# Patient Record
Sex: Female | Born: 1985 | Race: Black or African American | Hispanic: No | Marital: Single | Smoking: Current every day smoker
Health system: Southern US, Community
[De-identification: ages and names within clinical notes are randomized; demographics above are authoritative.]

## PROBLEM LIST (undated history)

## (undated) ENCOUNTER — Ambulatory Visit (HOSPITAL_COMMUNITY): Payer: Medicaid Other

## (undated) DIAGNOSIS — M419 Scoliosis, unspecified: Secondary | ICD-10-CM

## (undated) DIAGNOSIS — F1290 Cannabis use, unspecified, uncomplicated: Secondary | ICD-10-CM

## (undated) DIAGNOSIS — A6000 Herpesviral infection of urogenital system, unspecified: Secondary | ICD-10-CM

## (undated) DIAGNOSIS — L940 Localized scleroderma [morphea]: Secondary | ICD-10-CM

## (undated) DIAGNOSIS — D069 Carcinoma in situ of cervix, unspecified: Secondary | ICD-10-CM

## (undated) HISTORY — DX: Carcinoma in situ of cervix, unspecified: D06.9

## (undated) HISTORY — DX: Herpesviral infection of urogenital system, unspecified: A60.00

## (undated) HISTORY — PX: NO PAST SURGERIES: SHX2092

## (undated) HISTORY — DX: Cannabis use, unspecified, uncomplicated: F12.90

---
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
## 2004-01-22 ENCOUNTER — Emergency Department (HOSPITAL_COMMUNITY): Admission: EM | Admit: 2004-01-22 | Discharge: 2004-01-22 | Payer: Self-pay | Admitting: Emergency Medicine

## 2004-09-12 ENCOUNTER — Ambulatory Visit: Payer: Self-pay | Admitting: Family Medicine

## 2004-10-24 ENCOUNTER — Ambulatory Visit: Payer: Self-pay | Admitting: Family Medicine

## 2004-11-01 ENCOUNTER — Ambulatory Visit: Payer: Self-pay | Admitting: Family Medicine

## 2004-11-07 ENCOUNTER — Ambulatory Visit (HOSPITAL_COMMUNITY): Admission: RE | Admit: 2004-11-07 | Discharge: 2004-11-07 | Payer: Self-pay | Admitting: Internal Medicine

## 2004-11-11 ENCOUNTER — Ambulatory Visit (HOSPITAL_COMMUNITY): Admission: RE | Admit: 2004-11-11 | Discharge: 2004-11-11 | Payer: Self-pay | Admitting: Internal Medicine

## 2004-11-14 ENCOUNTER — Ambulatory Visit (HOSPITAL_COMMUNITY): Admission: RE | Admit: 2004-11-14 | Discharge: 2004-11-14 | Payer: Self-pay | Admitting: Internal Medicine

## 2004-11-18 ENCOUNTER — Ambulatory Visit: Payer: Self-pay | Admitting: Family Medicine

## 2004-11-27 ENCOUNTER — Ambulatory Visit: Payer: Self-pay | Admitting: Family Medicine

## 2004-12-04 ENCOUNTER — Ambulatory Visit: Payer: Self-pay | Admitting: Family Medicine

## 2005-01-29 ENCOUNTER — Ambulatory Visit: Payer: Self-pay | Admitting: Family Medicine

## 2005-01-30 ENCOUNTER — Ambulatory Visit: Payer: Self-pay | Admitting: Family Medicine

## 2005-07-09 ENCOUNTER — Emergency Department (HOSPITAL_COMMUNITY): Admission: EM | Admit: 2005-07-09 | Discharge: 2005-07-09 | Payer: Self-pay | Admitting: Emergency Medicine

## 2005-11-05 ENCOUNTER — Inpatient Hospital Stay (HOSPITAL_COMMUNITY): Admission: AD | Admit: 2005-11-05 | Discharge: 2005-11-09 | Payer: Self-pay | Admitting: *Deleted

## 2005-11-06 ENCOUNTER — Ambulatory Visit: Payer: Self-pay | Admitting: *Deleted

## 2006-11-26 ENCOUNTER — Ambulatory Visit (HOSPITAL_COMMUNITY): Admission: RE | Admit: 2006-11-26 | Discharge: 2006-11-26 | Payer: Self-pay | Admitting: Obstetrics and Gynecology

## 2007-01-12 ENCOUNTER — Ambulatory Visit (HOSPITAL_COMMUNITY): Admission: RE | Admit: 2007-01-12 | Discharge: 2007-01-12 | Payer: Self-pay | Admitting: Family Medicine

## 2007-01-27 ENCOUNTER — Ambulatory Visit: Payer: Self-pay | Admitting: Obstetrics & Gynecology

## 2007-04-02 ENCOUNTER — Ambulatory Visit: Payer: Self-pay | Admitting: Obstetrics & Gynecology

## 2007-04-07 ENCOUNTER — Inpatient Hospital Stay (HOSPITAL_COMMUNITY): Admission: AD | Admit: 2007-04-07 | Discharge: 2007-04-10 | Payer: Self-pay | Admitting: Obstetrics & Gynecology

## 2007-04-07 ENCOUNTER — Ambulatory Visit: Payer: Self-pay | Admitting: *Deleted

## 2007-07-01 ENCOUNTER — Encounter: Payer: Self-pay | Admitting: Obstetrics and Gynecology

## 2007-07-01 ENCOUNTER — Ambulatory Visit: Payer: Self-pay | Admitting: Obstetrics and Gynecology

## 2007-12-23 ENCOUNTER — Encounter: Payer: Self-pay | Admitting: Obstetrics and Gynecology

## 2007-12-23 ENCOUNTER — Ambulatory Visit: Payer: Self-pay | Admitting: Obstetrics and Gynecology

## 2008-01-06 ENCOUNTER — Encounter: Payer: Self-pay | Admitting: Obstetrics and Gynecology

## 2008-01-06 ENCOUNTER — Other Ambulatory Visit: Admission: RE | Admit: 2008-01-06 | Discharge: 2008-01-06 | Payer: Self-pay | Admitting: Obstetrics and Gynecology

## 2008-01-06 ENCOUNTER — Ambulatory Visit: Payer: Self-pay | Admitting: Obstetrics and Gynecology

## 2008-01-21 ENCOUNTER — Ambulatory Visit: Payer: Self-pay | Admitting: Obstetrics & Gynecology

## 2008-03-09 ENCOUNTER — Ambulatory Visit: Payer: Self-pay | Admitting: Obstetrics & Gynecology

## 2008-03-09 ENCOUNTER — Other Ambulatory Visit: Admission: RE | Admit: 2008-03-09 | Discharge: 2008-03-09 | Payer: Self-pay | Admitting: Obstetrics and Gynecology

## 2008-03-23 ENCOUNTER — Ambulatory Visit: Payer: Self-pay | Admitting: Obstetrics & Gynecology

## 2008-06-14 ENCOUNTER — Ambulatory Visit: Payer: Self-pay | Admitting: Obstetrics and Gynecology

## 2008-10-12 ENCOUNTER — Ambulatory Visit: Payer: Self-pay | Admitting: Obstetrics and Gynecology

## 2008-10-12 LAB — CONVERTED CEMR LAB
HCV Ab: NEGATIVE
Hepatitis B Surface Ag: NEGATIVE

## 2008-10-13 ENCOUNTER — Encounter: Payer: Self-pay | Admitting: Obstetrics and Gynecology

## 2008-10-13 LAB — CONVERTED CEMR LAB
GC Probe Amp, Genital: NEGATIVE
Yeast Wet Prep HPF POC: NONE SEEN

## 2009-06-14 ENCOUNTER — Emergency Department (HOSPITAL_COMMUNITY): Admission: EM | Admit: 2009-06-14 | Discharge: 2009-06-14 | Payer: Self-pay | Admitting: Emergency Medicine

## 2009-10-09 ENCOUNTER — Emergency Department (HOSPITAL_COMMUNITY): Admission: EM | Admit: 2009-10-09 | Discharge: 2009-10-09 | Payer: Self-pay | Admitting: Family Medicine

## 2010-05-14 ENCOUNTER — Emergency Department (HOSPITAL_COMMUNITY)
Admission: EM | Admit: 2010-05-14 | Discharge: 2010-05-14 | Payer: Self-pay | Source: Home / Self Care | Admitting: Emergency Medicine

## 2010-06-09 NOTE — L&D Delivery Note (Signed)
Delivery Note At 10:32 AM a viable female was delivered via  (Presentation: ;  ).  APGAR:9 - 9.  ; weight 6 lb 14 oz (3118 g).   Placenta status: , .  Cord: 3 vessels with the following complications:  none Anesthesia: none  Episiotomy: none Lacerations: none Suture Repair: none Est. Blood Loss (mL):  Mom to postpartum.  Baby to nursery-stable.  Vershawn Westrup A 01/18/2011, 11:24 AM

## 2010-07-16 LAB — HIV ANTIBODY (ROUTINE TESTING W REFLEX): HIV: NONREACTIVE

## 2010-07-16 LAB — ABO/RH: RH Type: POSITIVE

## 2010-07-16 LAB — RUBELLA ANTIBODY, IGM: Rubella: IMMUNE

## 2010-08-19 LAB — POCT PREGNANCY, URINE: Preg Test, Ur: POSITIVE

## 2010-08-25 LAB — POCT URINALYSIS DIP (DEVICE)
Glucose, UA: NEGATIVE mg/dL
Nitrite: NEGATIVE
Protein, ur: 100 mg/dL — AB
Urobilinogen, UA: 1 mg/dL (ref 0.0–1.0)
pH: 6 (ref 5.0–8.0)

## 2010-08-25 LAB — GLUCOSE, CAPILLARY: Glucose-Capillary: 115 mg/dL — ABNORMAL HIGH (ref 70–99)

## 2010-08-25 LAB — POCT I-STAT, CHEM 8
Calcium, Ion: 1.18 mmol/L (ref 1.12–1.32)
Sodium: 139 mEq/L (ref 135–145)

## 2010-08-25 LAB — POCT PREGNANCY, URINE: Preg Test, Ur: NEGATIVE

## 2010-08-25 LAB — TSH: TSH: 0.437 u[IU]/mL (ref 0.350–4.500)

## 2010-09-26 ENCOUNTER — Ambulatory Visit: Payer: Medicaid Other | Attending: Obstetrics & Gynecology | Admitting: Physical Therapy

## 2010-09-26 DIAGNOSIS — O99891 Other specified diseases and conditions complicating pregnancy: Secondary | ICD-10-CM | POA: Insufficient documentation

## 2010-09-26 DIAGNOSIS — IMO0001 Reserved for inherently not codable concepts without codable children: Secondary | ICD-10-CM | POA: Insufficient documentation

## 2010-09-26 DIAGNOSIS — M25559 Pain in unspecified hip: Secondary | ICD-10-CM | POA: Insufficient documentation

## 2010-10-08 ENCOUNTER — Ambulatory Visit: Payer: Medicaid Other | Attending: Obstetrics & Gynecology | Admitting: Physical Therapy

## 2010-10-08 DIAGNOSIS — O99891 Other specified diseases and conditions complicating pregnancy: Secondary | ICD-10-CM | POA: Insufficient documentation

## 2010-10-08 DIAGNOSIS — IMO0001 Reserved for inherently not codable concepts without codable children: Secondary | ICD-10-CM | POA: Insufficient documentation

## 2010-10-08 DIAGNOSIS — M25559 Pain in unspecified hip: Secondary | ICD-10-CM | POA: Insufficient documentation

## 2010-10-22 ENCOUNTER — Ambulatory Visit: Payer: Medicaid Other | Admitting: Physical Therapy

## 2010-10-22 NOTE — Group Therapy Note (Signed)
NAMESHAELEE, FORNI NO.:  [id number]   MEDICAL RECORD NO.:  [id number]          PATIENT TYPE:  WOC   LOCATION:  [facility]                   FACILITY:  [facility]   PHYSICIAN:  Caren Griffins, CNM       DATE OF BIRTH:  17-Jan-1986   DATE OF SERVICE:  07/01/2007                                  CLINIC NOTE   REASON FOR VISIT:  Pap.   INTERVAL HISTORY:  Since seen here for colposcopy on 01/27/2007, the  patient has been doing well.  She is concerned that she has had  constipation and also states that when she strains to have a bowel  movement, she has a pressure sensation around the area of the clitoris.  She says it causes throbbing but is not painful and this has been ever  since she had her vaginal delivery.  She has not been sexually active  since then.  She has not yet filled her Micronor prescription and she  still continues to breast feed.  Her LMP was 06/24/2007 and menses are  somewhat irregular since her delivery.  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  No biopsy  was done at her colposcopy exam due to her being pregnant. Otherwise her  review of systems essentially negative. Her baby is doing well.   PHYSICAL EXAM:  97, 9, 85,106/62.  Weight 125.6.  GENERAL:  Well-nourished, well-developed NAD.  Abbreviated physical  exam.  ABDOMEN:  Soft, flat, nontender PELVIC:  NEFG perineal laceration  well-healed.  Clitoris appears normal.  Clitoral hood is also normal.  There is slight separation at the apex where labia minora merge, no  tenderness, masses.  BSO negative.  Vagina fairly good tone and support  well ruggated.  Speculum exam: cervix reveals a erythematous and  slightly eversion of approximately 1 cm surrounding external os, no  lesions or friability with Pap smear, cervix is multiparous and  posterior.  Bimanual: uterus NNSP.  Adnexa no tenderness or masses.   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   PLAN:  Pap smear sent.  Follow-up pending results.  She is advised to  get generic Colace 100 mg p.o. q.h.s. also discussed other options for  constipation stressing the dietary adjustments she can make.           ______________________________  Caren Griffins, CNM     DP/MEDQ  D:  07/01/2007  T:  07/01/2007  Job:  [id number]

## 2010-10-22 NOTE — Group Therapy Note (Signed)
NAME:  [name] [name], [name] [name] NO.:  [id number]   MEDICAL RECORD NO.:  [id number]          PATIENT TYPE:  WOC   LOCATION:  [facility]                   FACILITY:  [facility]   PHYSICIAN:  Argentina Donovan, MD        DATE OF BIRTH:  12/05/1985   DATE OF SERVICE:  06/14/2008                                  CLINIC NOTE   The patient is a 26 year old African American female gravida 1, para 1-0-  0-1 who has been on Depo-Provera since July and has bled most of the  time she has been on that, sometimes spotting, sometimes a flow.  She  also has had a fowl odor since the beginning and came in for a Gardasil  and a Depo-Provera shot.  I have talked to her about this and I think we  will try switching it just to oral contraceptives and I think she would  be much happier.  We will put her on Loestrin FE 24.  Start her on that  and give her a prescription for some Flagyl.  I told her that if the  odor did not away, then once the bleeding had stopped, to come on back  in and we will reexamine her and decide what to do with her.  In  addition to this she is going to get the Gardasil.  She sounds happy  with this suggestion .   IMPRESSION:  Is breakthrough bleeding with fowl vaginal odor.           ______________________________  Argentina Donovan, MD     PR/MEDQ  D:  06/14/2008  T:  06/14/2008  Job:  [id number]

## 2010-10-22 NOTE — Group Therapy Note (Signed)
NAME:  [name] [name], [name] [name] NO.:  [id number]   MEDICAL RECORD NO.:  [id number]          PATIENT TYPE:  WOC   LOCATION:  [facility]                   [facility]ACILITY:  [facility]   PHYSICIAN:  Argentina Donovan, MD        DATE OF BIRTH:  1985-07-23   DATE OF SERVICE:                                  CLINIC NOTE   NOTATION:  Dictated by Dorathy Kinsman, S.N.M., for Dr. Argentina Donovan.   REASON FOR VISIT:  Annual GYN exam, Pap and STD testing.   MEDICATIONS:  The patient is currently taking a vitamin.   ALLERGIES:  No known drug allergies.   IMMUNIZATIONS:  Are up-to-date.   MENSTRUAL HISTORY:  First menstrual cycle started at 25 years of age.  First day of last menstrual period was December 16, 2007.  Cycles are 23 days  apart, lasting for 5 to 7 days long.   GYN HISTORY:  Positive for abnormal Pap about four years ago.  Abnormal  Pap #LSIL in July 2008.  Colposcopy in August 2008.  No biopsy because  the patient was pregnant.  Follow-up Pap in January 2009, was ascus,  positive HPV.   OB HISTORY:  The patient is a G 1, P 1, 0, 0, 1, normal vaginal  delivery.   PAST SURGICAL HISTORY:  None.   FAMILY HISTORY:  Diabetes in a grandmother.  Heart attacks, grandfather.  High blood pressure in grandmother.  Blood clots in grandmother.   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   SOCIAL HISTORY:  The patient lives with her mother, nieces and nephews.  Smokes one pack of cigarettes every other day.  Has smoked for seven  years.  Drinks one to two caffeinated beverages per day.  Denies drug  use or alcohol use.  Complains of occasional fatigue.   REVIEW OF SYSTEMS:  Negative except for a lesion on her right labia  majora that she has had for two days.   PHYSICAL EXAMINATION:  VITAL SIGNS:  Temperature 97.6 degrees, pulse 73,  respirations 20, blood pressure 94/64, weight 109 pounds, height 5 feet  9 inches.  GENERAL:  The patient  is a slender African American female, well-  nourished and well-hydrated.  HEENT:  Normocephalic.  NECK:  Supple, no thyromegaly.  HEART:  Normal S1 and S2.  No murmurs, rubs or gallops.  A regular rate  and rhythm.  LUNGS:  Clear to auscultation bilaterally.  Normal respiratory effort.  ABDOMEN:  Soft, nontender.  No masses, no hepatosplenomegaly.  PELVIC EXAM:  A 1 cm flesh-colored mass surrounding a hair follicle was  noted on the right labia majora.  No erythema or purulent discharge.  Mild tenderness per the patient, consistent with folliculitis.  On  pelvic exam the patient had smooth, well-rugated vaginal walls with a  small amount of thin white discharge, no odor.  Cervix was midline,  pink.  No lesions.  Pap smear obtained.  Gonorrhea and Chlamydia will be  obtained on the Pap smear.  BIMANUAL EXAM:  No cervical motion tenderness.  No adnexal masses.  BREASTS:  No dimpling, nipple discharge or masses.  No lymphadenopathy.  SKIN:  Warm and dry and appropriate for race.  NEUROLOGIC:  Deep tendon reflexes 2+, no clonus.  EXTREMITIES:  No edema, negative Homan's.  Capillary refill less than 3  seconds.   ASSESSMENT:  Essentially normal gynecological examination, with the  exception of folliculitis on the right labia majora.   RECOMMENDATIONS:  Warm compresses and soaks and instructed the patient  to report signs of infection, purulent discharge, erythema or fever.  The patient also does not desire pregnancy at this time and would like  Depo.  Performed a UPT today which was negative.  Will repeat in two  weeks and give Depo if this is negative.  The patient was tested for  gonorrhea, Chlamydia, HIV and RPR.  Will follow up in two weeks for Depo-  Provera, and if the Pap is abnormal will perform a colposcopy and biopsy  at that time.           ______________________________  Argentina Donovan, MD     PR/MEDQ  D:  12/23/2007  T:  12/23/2007  Job:  [id number]

## 2010-10-25 NOTE — Discharge Summary (Signed)
NAME:  [name] [name], [name] [name] NO.:  [id number]   MEDICAL RECORD NO.:  [id number]          PATIENT TYPE:  IPS   LOCATION:  0403                          [facility]ACILITY:  [facility]   PHYSICIAN:  Jasmine Pang, M.D. DATE OF BIRTH:  April 15, 1986   DATE OF ADMISSION:  11/05/2005  DATE OF DISCHARGE:  11/09/2005                                 DISCHARGE SUMMARY   IDENTIFICATION:  The patient is a 25 year old single African-American female  who was admitted on an involuntary basis to my service on Nov 05, 2005.   HISTORY OF PRESENT ILLNESS:  The patient presented in the emergency  department at [facility] with some agitation and apparent delusional  ideation.  She suspected she was pregnant.  She had two bowel movements in  the a.m. with some bleeding and she was afraid she was miscarrying.  She was  examined by the M.D. and found not to be pregnant.  She refused the M.D.'s  assurance that she was not pregnant.  She continued to believe she was.  She  has had some suicidal ideation for the past two weeks.  Recent stressors  include worried about getting along with her mother and no money.  She has  no current psychiatric care.  She was previously treated at [facility] two years ago for depression.  She was given Paxil  for suicidal thoughts.  The patient states she smokes marijuana.  No other  drug use.   MEDICAL HISTORY:  Denies except for her continued thoughts that she is  pregnant.   MEDICATIONS:  No medications.  Previously, she has been on Depo Provera  injection.  Last dose in August of 2006.  She has no contraception  currently.   ALLERGIES:  No known drug allergies.   PHYSICAL EXAMINATION:  Negative at [facility].  She was a thin, frail,  African-American female in no acute distress.  There were no abnormal  physical findings on the exam and again she was found not to be pregnant.   LABORATORY DATA:  Admission laboratories done at [facility], CBC was  remarkable for a hemoglobin of 12.6 and hematocrit of 37.8, platelets were  200,000.  The white blood cell count was 9.3.  Basic metabolic panel  revealed sodium 140, potassium 5.8, BUN 0.6, glucose 84, calcium 9.1.  The  rest of the basic metabolic panel was within normal limits.  Serum urine  pregnancy test was negative.  She was examined at [facility].  Urinalysis showed ketones.   HOSPITAL COURSE:  Upon admission, the patient was started on Ambien 5 mg  p.o. q.h.s. p.r.n., may repeat x1 in one hour.  On Nov 05, 2005, the patient  was started on Zyprexa Zydis 5 mg p.o. on admission, Ativan 1 mg p.o. on  admission.  She was also started on Zyprexa 5 mg q.6h. p.r.n. agitation and  Ativan 1 mg p.o. q.6h. p.r.n. agitation.  On Nov 06, 2005, the patient was  started on Valtrex 500 mg daily.  On November 09, 2005, the patient was ordered  Phenergan 25 mg p.o. q.6h.  p.r.n. nausea.   Upon initially meeting patient, on Nov 06, 2005, to introduce myself, she  stated she still thought she was pregnant.  She went to [facility] and  continued to maintain she was pregnant there.  She thought the hospital was  not helping her.  Then the ACT team were called in.  She has flight of ideas  and is hyperverbal.  She has pressured speech.  She was difficult to  redirect.  She appeared to be quite manic.  On November 07, 2005, the patient  states she is doing well today.  She understands she is here for help and  feels better about it.  She is somewhat guarded with staff.  Overall, she  does not seem to be paranoid now.  A family session with her mother was  scheduled for the next day and then discharge planned.   On November 08, 2005, social work met with patient and patient's mother.  The  main concerns addressed were managing finances and reducing stress.  The  patient spoke about a plan to return to her mother's where she helps take  care of three small children.  The patient spoke  about not knowing how to  manage her money and save her money and discussed a plan for her mother to  take control of finances and assist.  The patient also spoke about being  dependent upon her mother for transportation but described how they had  worked this out so that patient could go to work and attend therapy  appointments.  The patient identified ways to reduce stress in her life by  having a positive attitude, praying, talking to friends and watching TV.  She spoke about wanting to have her own life.  She also spoke about  grandiose plans of moving to a large city and modeling.  The patient's  mother was a clear influence on helping the patient stay focused and reach  towards realistic goals.  The patient said she felt good on her nerve  pills and that she was stable on her current medication.  Recommendations  included referral for individual therapist as well as for medication  management with a psychiatrist.   On November 08, 2005, the patient's mental status had improved markedly.  Her  mood was less depressed, anxious and irritable.  Affect was wide range.  No  suicidal or homicidal ideation.  No auditory or visual hallucinations.  No  paranoia or delusions anymore.  Thought processes were logical and goal  directed.  Thought content with no predominant theme.  Cognitive exam was  grossly within normal limits.  Her judgment and insight had improved  markedly.  She did refuse to be discharged on any meds for my mind.  She  was agreeable to follow-up therapy.   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  Rule out bipolar disorder with manic state with psychosis, severe.  AXIS II:  None.  AXIS III:  No diagnosis.  AXIS IV:  Moderate (stress with finances and problems with primary support  group).  AXIS V:  GAF upon discharge 45; GAF upon admission 25; GAF highest past year  65.   ACTIVITY/DIET:  The patient had no specific activity level or  dietary restrictions.   FOLLOWUP:  She was to follow up with her primary care M.D.   DISCHARGE MEDICATIONS:  Valtrex 500 mg p.o. daily.   POST-HOSPITAL CARE PLANS:  The casemanager was going to call for  follow-up  appointment on Monday, November 10, 2005.      Jasmine Pang, M.D.  Electronically Signed     BHS/MEDQ  D:  12/02/2005  T:  12/02/2005  Job:  [id number]

## 2010-11-05 ENCOUNTER — Ambulatory Visit: Payer: Medicaid Other | Admitting: Physical Therapy

## 2010-12-19 LAB — STREP B DNA PROBE: GBS: NEGATIVE

## 2011-01-18 ENCOUNTER — Inpatient Hospital Stay (HOSPITAL_COMMUNITY)
Admission: AD | Admit: 2011-01-18 | Discharge: 2011-01-20 | DRG: 775 | Disposition: A | Discharge status: Home / Self Care | Payer: Medicaid Other | Source: Ambulatory Visit | Attending: Obstetrics & Gynecology | Admitting: Obstetrics & Gynecology

## 2011-01-18 ENCOUNTER — Encounter (HOSPITAL_COMMUNITY): Payer: Self-pay | Admitting: *Deleted

## 2011-01-18 ENCOUNTER — Inpatient Hospital Stay (HOSPITAL_COMMUNITY): Admission: RE | Admit: 2011-01-18 | Payer: Medicaid Other | Source: Ambulatory Visit

## 2011-01-18 DIAGNOSIS — B999 Unspecified infectious disease: Secondary | ICD-10-CM | POA: Insufficient documentation

## 2011-01-18 DIAGNOSIS — IMO0002 Reserved for concepts with insufficient information to code with codable children: Secondary | ICD-10-CM | POA: Insufficient documentation

## 2011-01-18 DIAGNOSIS — Z3490 Encounter for supervision of normal pregnancy, unspecified, unspecified trimester: Secondary | ICD-10-CM

## 2011-01-18 DIAGNOSIS — O480 Post-term pregnancy: Principal | ICD-10-CM | POA: Diagnosis present

## 2011-01-18 LAB — CBC
HCT: 36.9 % (ref 36.0–46.0)
Hemoglobin: 12.2 g/dL (ref 12.0–15.0)
MCHC: 33.1 g/dL (ref 30.0–36.0)
MCV: 84.6 fL (ref 78.0–100.0)
RDW: 14 % (ref 11.5–15.5)
WBC: 7.7 10*3/uL (ref 4.0–10.5)

## 2011-01-18 MED ORDER — DIBUCAINE 1 % RE OINT
1.0000 "application " | TOPICAL_OINTMENT | RECTAL | Status: DC | PRN
  Administered 2011-01-19: 1 via RECTAL
  Filled 2011-01-18: qty 28

## 2011-01-18 MED ORDER — LIDOCAINE HCL (PF) 1 % IJ SOLN: 30.0000 mL | INTRAMUSCULAR | Status: DC | PRN

## 2011-01-18 MED ORDER — DIPHENHYDRAMINE HCL 25 MG PO CAPS
25.0000 mg | ORAL_CAPSULE | Freq: Four times a day (QID) | ORAL | Status: DC | PRN
Start: 2011-01-18 — End: 2011-01-20

## 2011-01-18 MED ORDER — NALBUPHINE SYRINGE 5 MG/0.5 ML
10.0000 mg | INJECTION | INTRAMUSCULAR | Status: DC | PRN
  Administered 2011-01-18: 10 mg via INTRAVENOUS
  Filled 2011-01-18: qty 1

## 2011-01-18 MED ORDER — ONDANSETRON HCL 4 MG/2ML IJ SOLN: 4.0000 mg | Freq: Four times a day (QID) | INTRAMUSCULAR | Status: DC | PRN

## 2011-01-18 MED ORDER — FLEET ENEMA 7-19 GM/118ML RE ENEM: 1.0000 | ENEMA | RECTAL | Status: DC | PRN

## 2011-01-18 MED ORDER — CITRIC ACID-SODIUM CITRATE 334-500 MG/5ML PO SOLN: 30.0000 mL | ORAL | Status: DC | PRN

## 2011-01-18 MED ORDER — OXYTOCIN 10 UNIT/ML IJ SOLN
INTRAMUSCULAR | Status: AC
  Administered 2011-01-18: 20 [IU]
  Filled 2011-01-18: qty 2

## 2011-01-18 MED ORDER — OXYCODONE-ACETAMINOPHEN 5-325 MG PO TABS
1.0000 | ORAL_TABLET | ORAL | Status: DC | PRN
  Administered 2011-01-18 – 2011-01-20 (×7): 1 via ORAL
  Filled 2011-01-18 (×7): qty 1

## 2011-01-18 MED ORDER — LACTATED RINGERS IV SOLN: 500.0000 mL | INTRAVENOUS | Status: DC | PRN

## 2011-01-18 MED ORDER — ZOLPIDEM TARTRATE 5 MG PO TABS: 5.0000 mg | ORAL_TABLET | Freq: Every evening | ORAL | Status: DC | PRN

## 2011-01-18 MED ORDER — HYDROXYZINE HCL 50 MG/ML IM SOLN
50.0000 mg | Freq: Four times a day (QID) | INTRAMUSCULAR | Status: DC | PRN
  Administered 2011-01-18: 50 mg via INTRAMUSCULAR
  Filled 2011-01-18: qty 1

## 2011-01-18 MED ORDER — SIMETHICONE 80 MG PO CHEW: 80.0000 mg | CHEWABLE_TABLET | ORAL | Status: DC | PRN

## 2011-01-18 MED ORDER — OXYTOCIN BOLUS FROM INFUSION
500.0000 mL | Freq: Once | INTRAVENOUS | Status: DC
  Filled 2011-01-18: qty 500
  Filled 2011-01-18 (×2): qty 1000

## 2011-01-18 MED ORDER — LANOLIN HYDROUS EX OINT: TOPICAL_OINTMENT | CUTANEOUS | Status: DC | PRN

## 2011-01-18 MED ORDER — OXYTOCIN 20 UNITS IN LACTATED RINGERS INFUSION - SIMPLE: 125.0000 mL/h | INTRAVENOUS | Status: DC | PRN

## 2011-01-18 MED ORDER — OXYCODONE-ACETAMINOPHEN 5-325 MG PO TABS: 2.0000 | ORAL_TABLET | ORAL | Status: DC | PRN

## 2011-01-18 MED ORDER — MEASLES, MUMPS & RUBELLA VAC ~~LOC~~ INJ
0.5000 mL | INJECTION | Freq: Once | SUBCUTANEOUS | Status: DC
  Filled 2011-01-18: qty 0.5

## 2011-01-18 MED ORDER — ACETAMINOPHEN 325 MG PO TABS: 650.0000 mg | ORAL_TABLET | ORAL | Status: DC | PRN

## 2011-01-18 MED ORDER — LIDOCAINE HCL (PF) 1 % IJ SOLN
INTRAMUSCULAR | Status: AC
  Filled 2011-01-18: qty 30

## 2011-01-18 MED ORDER — OXYTOCIN 10 UNIT/ML IJ SOLN: 40.0000 [IU] | Freq: Once | INTRAVENOUS | Status: DC

## 2011-01-18 MED ORDER — SENNOSIDES-DOCUSATE SODIUM 8.6-50 MG PO TABS
2.0000 | ORAL_TABLET | Freq: Every day | ORAL | Status: DC
  Administered 2011-01-18 – 2011-01-19 (×2): 2 via ORAL

## 2011-01-18 MED ORDER — ONDANSETRON HCL 4 MG PO TABS: 4.0000 mg | ORAL_TABLET | ORAL | Status: DC | PRN

## 2011-01-18 MED ORDER — NALOXONE HCL 0.4 MG/ML IJ SOLN
INTRAMUSCULAR | Status: AC
  Filled 2011-01-18: qty 1

## 2011-01-18 MED ORDER — METHYLERGONOVINE MALEATE 0.2 MG PO TABS: 0.2000 mg | ORAL_TABLET | ORAL | Status: DC | PRN

## 2011-01-18 MED ORDER — MEDROXYPROGESTERONE ACETATE 150 MG/ML IM SUSP: 150.0000 mg | INTRAMUSCULAR | Status: DC | PRN

## 2011-01-18 MED ORDER — LACTATED RINGERS IV SOLN: INTRAVENOUS | Status: DC

## 2011-01-18 MED ORDER — OXYTOCIN 20 UNITS IN LACTATED RINGERS INFUSION - SIMPLE
125.0000 mL/h | INTRAVENOUS | Status: DC
  Administered 2011-01-18: 125 mL/h via INTRAVENOUS
  Administered 2011-01-18: 999 mL/h via INTRAVENOUS

## 2011-01-18 MED ORDER — TETANUS-DIPHTH-ACELL PERTUSSIS 5-2.5-18.5 LF-MCG/0.5 IM SUSP
0.5000 mL | Freq: Once | INTRAMUSCULAR | Status: AC
  Administered 2011-01-19: 0.5 mL via INTRAMUSCULAR
  Filled 2011-01-18: qty 0.5

## 2011-01-18 MED ORDER — IBUPROFEN 600 MG PO TABS
600.0000 mg | ORAL_TABLET | Freq: Four times a day (QID) | ORAL | Status: DC | PRN
  Administered 2011-01-18: 600 mg via ORAL
  Filled 2011-01-18: qty 1

## 2011-01-18 MED ORDER — WITCH HAZEL-GLYCERIN EX PADS: 1.0000 "application " | MEDICATED_PAD | CUTANEOUS | Status: DC | PRN

## 2011-01-18 MED ORDER — METHYLERGONOVINE MALEATE 0.2 MG/ML IJ SOLN: 0.2000 mg | INTRAMUSCULAR | Status: DC | PRN

## 2011-01-18 MED ORDER — NALBUPHINE SYRINGE 5 MG/0.5 ML
10.0000 mg | INJECTION | Freq: Four times a day (QID) | INTRAMUSCULAR | Status: DC | PRN
  Administered 2011-01-18: 10 mg via INTRAMUSCULAR
  Filled 2011-01-18: qty 1

## 2011-01-18 MED ORDER — PRENATAL PLUS 27-1 MG PO TABS
1.0000 | ORAL_TABLET | Freq: Every day | ORAL | Status: DC
  Administered 2011-01-19: 1 via ORAL
  Filled 2011-01-18 (×2): qty 1

## 2011-01-18 MED ORDER — BENZOCAINE-MENTHOL 20-0.5 % EX AERO
1.0000 "application " | INHALATION_SPRAY | CUTANEOUS | Status: DC | PRN
  Administered 2011-01-19: 1 via TOPICAL

## 2011-01-18 MED ORDER — IBUPROFEN 600 MG PO TABS
600.0000 mg | ORAL_TABLET | Freq: Four times a day (QID) | ORAL | Status: DC
  Administered 2011-01-18 – 2011-01-20 (×8): 600 mg via ORAL
  Filled 2011-01-18 (×8): qty 1

## 2011-01-18 MED ORDER — ONDANSETRON HCL 4 MG/2ML IJ SOLN: 4.0000 mg | INTRAMUSCULAR | Status: DC | PRN

## 2011-01-18 NOTE — Progress Notes (Signed)
ADRENA NAKAMURA is a 25 y.o. G2P1001 at [redacted]w[redacted]d by LMP admitted for induction of labor due to Post dates. Due date 01-14-2011..  Subjective:   Objective: BP 122/70  Pulse 82  Temp(Src) 97.9 F (36.6 C) (Oral)  Resp 20  Ht 5\' 9"  (1.753 m)  Wt 72.122 kg (159 lb)  BMI 23.48 kg/m2      FHT:  FHR: 150 bpm, variability: moderate,  accelerations:  Present,  decelerations:  Absent UC:   regular, every 3 minutes SVE:   Dilation: Lip/rim Effacement (%): 90 Station: 0;+1 Exam by:: K.FORSELL,RNC  Labs: Lab Results  Component Value Date   WBC 7.7 01/18/2011   HGB 12.2 01/18/2011   HCT 36.9 01/18/2011   MCV 84.6 01/18/2011   PLT 210 01/18/2011    Assessment / Plan: Spontaneous labor, progressing normally  Labor: Progressing normally Preeclampsia:  n/a Fetal Wellbeing:  Category I Pain Control:  Nubain/Vistaril I/D:  n/a Anticipated MOD:  NSVD  HARPER,CHARLES A 01/18/2011, 9:45 AM

## 2011-01-18 NOTE — H&P (Signed)
[name] [name] is a 25 y.o. female presenting for IOL. Maternal Medical History:  Reason for admission: IOL for post dates.  Fetal activity: Perceived fetal activity is normal.   Last perceived fetal movement was within the past hour.    Prenatal complications: no prenatal complications   OB History    Grav Para Term Preterm Abortions TAB SAB Ect Mult Living   2 1 1  0 0 0 0 0 0 1     Past Medical History  Diagnosis Date  . Abnormal Pap smear     HX HPV  . Infection     HX HSV   History reviewed. No pertinent past surgical history. Family History: family history is not on file. Social History:  reports that she has never smoked. She has never used smokeless tobacco. She reports that she does not drink alcohol or use illicit drugs.  Review of Systems  All other systems reviewed and are negative.    Dilation: Lip/rim Effacement (%): 90 Station: 0;+1 Exam by:: K.FORSELL,RNC Blood pressure 122/70, pulse 82, temperature 97.9 F (36.6 C), temperature source Oral, resp. rate 20, height 5\' 9"  (1.753 m), weight 72.122 kg (159 lb). Maternal Exam:  Uterine Assessment: Contraction strength is moderate.  Contraction duration is 1 minute. Contraction frequency is regular.   Abdomen: Patient reports no abdominal tenderness. Fetal presentation: vertex  Introitus: Normal vulva. Normal vagina.    Physical Exam  Nursing note and vitals reviewed. Constitutional: She is oriented to person, place, and time. She appears well-developed and well-nourished.  HENT:  Head: Normocephalic and atraumatic.  Eyes: Conjunctivae are normal. Pupils are equal, round, and reactive to light.  Neck: Normal range of motion. Neck supple.  Cardiovascular: Normal rate and regular rhythm.   Respiratory: Effort normal.  GI: Soft.  Genitourinary: Vagina normal and uterus normal.  Musculoskeletal: Normal range of motion.  Neurological: She is alert and oriented to person, place, and time.  Skin: Skin is  warm and dry.  Psychiatric: She has a normal mood and affect. Her behavior is normal. Judgment and thought content normal.    Prenatal labs: ABO, Rh:   Antibody: Negative (02/07 0000) Rubella:   RPR: Nonreactive (02/07 0000)  HBsAg:    HIV: Non-reactive (02/07 0000)  GBS: Negative (07/12 0000)   Assessment/Plan: Post dates.  IOL.   HARPER,CHARLES A 01/18/2011, 9:38 AM

## 2011-01-19 LAB — CBC
Platelets: 181 10*3/uL (ref 150–400)
RBC: 3.68 MIL/uL — ABNORMAL LOW (ref 3.87–5.11)
WBC: 10.8 10*3/uL — ABNORMAL HIGH (ref 4.0–10.5)

## 2011-01-19 MED ORDER — BENZOCAINE-MENTHOL 20-0.5 % EX AERO
INHALATION_SPRAY | CUTANEOUS | Status: AC
  Administered 2011-01-19: 1 via TOPICAL
  Filled 2011-01-19: qty 56

## 2011-01-19 NOTE — Progress Notes (Signed)
Per mom recently fed , per mom I know what to do I've BF before . Discussed with mom we F/U with latch check in am.   

## 2011-01-19 NOTE — Progress Notes (Signed)
Post Partum Day 1 Subjective: no complaints, up ad lib, voiding, tolerating PO and + flatus  Objective: Blood pressure 86/53, pulse 80, temperature 97.9 F (36.6 C), temperature source Oral, resp. rate 18, height 5\' 9"  (1.753 m), weight 72.122 kg (159 lb), unknown if currently breastfeeding.  Physical Exam:  General: alert and no distress Lochia: appropriate Uterine Fundus: firm Incision: n/a DVT Evaluation: No evidence of DVT seen on physical exam.   Basename 01/19/11 0527 01/18/11 0816  HGB 10.3* 12.2  HCT 31.2* 36.9    Assessment/Plan: Plan for discharge tomorrow   LOS: 1 day   HARPER,CHARLES A 01/19/2011, 7:04 AM

## 2011-01-20 MED ORDER — IBUPROFEN 600 MG PO TABS: 600.0000 mg | ORAL_TABLET | Freq: Four times a day (QID) | ORAL | Status: DC

## 2011-01-20 NOTE — Discharge Summary (Signed)
Obstetric Discharge Summary Reason for Admission: induction of labor Prenatal Procedures: ultrasound Intrapartum Procedures: spontaneous vaginal delivery Postpartum Procedures: none Complications-Operative and Postpartum: none Hemoglobin  Date Value Range Status  01/19/2011 10.3* 12.0-15.0 (g/dL) Final     HCT  Date Value Range Status  01/19/2011 31.2* 36.0-46.0 (%) Final    Discharge Diagnoses: Term Pregnancy-delivered  Discharge Information: Date: 01/20/2011 Activity: pelvic rest Diet: routine Medications: PNV and Ibuprophen Condition: stable Instructions: refer to practice specific booklet Discharge to: home Follow-up Information    Follow up with HARPER,CHARLES A, MD. Make an appointment in 6 weeks.   Contact information:   [address] [place] [address] [phone]          Newborn Data: Live born female  Birth Weight: 6 lb 14 oz (3118 g) APGAR: 9, 9  Home with mother.  HARPER,CHARLES A 01/20/2011, 1:11 PM

## 2011-01-20 NOTE — Progress Notes (Signed)
UR chart review completed.  

## 2011-01-20 NOTE — Progress Notes (Signed)
Post Partum Day 2 Subjective: no complaints, up ad lib, voiding, tolerating PO and + flatus  Objective: Blood pressure 99/62, pulse 90, temperature 98.2 F (36.8 C), temperature source Oral, resp. rate 18, height 5\' 9"  (1.753 m), weight 72.122 kg (159 lb), unknown if currently breastfeeding.  Physical Exam:  General: alert and no distress Lochia: appropriate Uterine Fundus: firm Incision: n/a DVT Evaluation: No evidence of DVT seen on physical exam.   Basename 01/19/11 0527 01/18/11 0816  HGB 10.3* 12.2  HCT 31.2* 36.9    Assessment/Plan: Discharge home   LOS: 2 days   HARPER,CHARLES A 01/20/2011, 1:10 PM

## 2011-03-07 LAB — POCT PREGNANCY, URINE
Operator id: 159681
Preg Test, Ur: NEGATIVE
Preg Test, Ur: NEGATIVE

## 2011-03-19 LAB — CBC
MCHC: 33.8
MCV: 83.7
Platelets: 242
RBC: 4.34
RDW: 13.2

## 2011-03-19 LAB — RPR: RPR Ser Ql: NONREACTIVE

## 2011-06-25 ENCOUNTER — Encounter (HOSPITAL_COMMUNITY): Payer: Self-pay | Admitting: Cardiology

## 2011-06-25 ENCOUNTER — Emergency Department (HOSPITAL_COMMUNITY)
Admission: EM | Admit: 2011-06-25 | Discharge: 2011-06-25 | Disposition: A | Discharge status: Home / Self Care | Payer: Medicaid Other | Source: Home / Self Care

## 2011-06-25 DIAGNOSIS — M549 Dorsalgia, unspecified: Secondary | ICD-10-CM

## 2011-06-25 DIAGNOSIS — G8929 Other chronic pain: Secondary | ICD-10-CM

## 2011-06-25 HISTORY — DX: Scoliosis, unspecified: M41.9

## 2011-06-25 HISTORY — DX: Localized scleroderma (morphea): L94.0

## 2011-06-25 MED ORDER — METHOCARBAMOL 750 MG PO TABS: 750.0000 mg | ORAL_TABLET | Freq: Four times a day (QID) | ORAL | Status: AC

## 2011-06-25 MED ORDER — DICLOFENAC SODIUM 75 MG PO TBEC: 75.0000 mg | DELAYED_RELEASE_TABLET | Freq: Two times a day (BID) | ORAL | Status: DC

## 2011-06-25 NOTE — ED Provider Notes (Signed)
History     CSN: [id number]  Arrival date & time 06/25/11  4098   None     Chief Complaint  Patient presents with  . Back Pain    (Consider location/radiation/quality/duration/timing/severity/associated sxs/prior treatment) HPI Comments: Patient presents with complaints of back pain. She states she has had back pain off and on since she was 26 years old. The pain began again 2 days ago. The pain is located from her mid to lower back bilaterally and is constant. She has tried ibuprofen without relief. She denies recent injury but has been carrying her 20 pound infant and thinks that this may be aggravating her back pain. There is no radiation of pain. She denies any paresthesia of her lower extremities. Patient states that in the past she saw a chiropractor who advised her that she had a mild scoliosis.The treatments did help her but she has not returned for years. She currently does not have a PCP but states she is going to begin seeing one on [address] just couldn't get an appt today.    Past Medical History  Diagnosis Date  . Abnormal Pap smear     HX HPV  . Infection     HX HSV  . Scoliosis   . Morphea     Past Surgical History  Procedure Date  . Vaginal delivery 01/2011, 03/2007    Family History  Problem Relation Age of Onset  . Diabetes Other   . Hypertension Other     History  Substance Use Topics  . Smoking status: Current Everyday Smoker -- 0.5 packs/day  . Smokeless tobacco: Never Used  . Alcohol Use: No    OB History    Grav Para Term Preterm Abortions TAB SAB Ect Mult Living   2 2 2  0 0 0 0 0 0 2      Review of Systems  Constitutional: Negative for fever and chills.  Gastrointestinal: Negative for abdominal pain.  Musculoskeletal: Positive for back pain. Negative for joint swelling and gait problem.  Skin: Negative for rash.  Neurological: Negative for weakness and numbness.    Allergies  Review of patient's allergies indicates no known  allergies.  Home Medications   Current Outpatient Rx  Name Route Sig Dispense Refill  . PRENATAL PLUS 27-1 MG PO TABS Oral Take 1 tablet by mouth daily.      Marland Kitchen DICLOFENAC SODIUM 75 MG PO TBEC Oral Take 1 tablet (75 mg total) by mouth 2 (two) times daily. 20 tablet 0  . METHOCARBAMOL 750 MG PO TABS Oral Take 1 tablet (750 mg total) by mouth 4 (four) times daily. 20 tablet 0    BP 98/60  Pulse 87  Temp(Src) 98.2 F (36.8 C) (Oral)  Resp 20  SpO2 99%  LMP 04/20/2011  Breastfeeding? No  Physical Exam  Nursing note and vitals reviewed. Constitutional: She appears well-developed and well-nourished. No distress.  HENT:  Head: Normocephalic and atraumatic.  Cardiovascular: Normal rate, regular rhythm and normal heart sounds.   Pulmonary/Chest: Effort normal and breath sounds normal. No respiratory distress.  Musculoskeletal:       Thoracic back: She exhibits tenderness and bony tenderness. She exhibits normal range of motion, no swelling, no edema, no deformity and no spasm.       Lumbar back: She exhibits tenderness and bony tenderness. She exhibits normal range of motion, no edema, no deformity and no spasm.       Back:       Bilat  LE strength +5. Neg SLR bilat. No discomfort noted with change of positions or ROM.   Neurological: She is alert. She has normal strength. Gait normal.  Reflex Scores:      Patellar reflexes are 2+ on the right side and 2+ on the left side. Skin: Skin is warm and dry. No rash noted.  Psychiatric: She has a normal mood and affect.    ED Course  Procedures (including critical care time)  Labs Reviewed - No data to display No results found.   1. Chronic back pain       MDM  Exacerbation of chronic back pain. Patient reports history of mild scoliosis. Advised followup with chiropractor or a new primary care doctor.        Melody Comas, [place] 06/25/11 1032

## 2011-06-25 NOTE — ED Notes (Signed)
Pt reports history of back pain off and on for years. Has an infant son that she carries. She feels that this may have made her back pain worse. Her pain is from between her shoulder blades to lower back and is described to be sore. Denies injury.

## 2011-06-27 NOTE — ED Provider Notes (Signed)
Medical screening examination/treatment/procedure(s) were performed by non-physician practitioner and as supervising physician I was immediately available for consultation/collaboration.  Luiz Blare MD   Luiz Blare, MD 06/27/11 [phone]

## 2012-01-05 ENCOUNTER — Emergency Department (INDEPENDENT_AMBULATORY_CARE_PROVIDER_SITE_OTHER): Payer: Medicaid Other

## 2012-01-05 ENCOUNTER — Emergency Department (HOSPITAL_COMMUNITY)
Admission: EM | Admit: 2012-01-05 | Discharge: 2012-01-05 | Disposition: A | Discharge status: Home / Self Care | Payer: Medicaid Other | Source: Home / Self Care

## 2012-01-05 ENCOUNTER — Encounter (HOSPITAL_COMMUNITY): Payer: Self-pay | Admitting: *Deleted

## 2012-01-05 DIAGNOSIS — M79645 Pain in left finger(s): Secondary | ICD-10-CM

## 2012-01-05 DIAGNOSIS — M79609 Pain in unspecified limb: Secondary | ICD-10-CM

## 2012-01-05 DIAGNOSIS — S6990XA Unspecified injury of unspecified wrist, hand and finger(s), initial encounter: Secondary | ICD-10-CM

## 2012-01-05 MED ORDER — DICLOFENAC SODIUM 50 MG PO TBEC: 50.0000 mg | DELAYED_RELEASE_TABLET | Freq: Two times a day (BID) | ORAL | Status: AC

## 2012-01-05 MED ORDER — IBUPROFEN 800 MG PO TABS
800.0000 mg | ORAL_TABLET | Freq: Once | ORAL | Status: AC
  Administered 2012-01-05: 800 mg via ORAL

## 2012-01-05 MED ORDER — IBUPROFEN 800 MG PO TABS
ORAL_TABLET | ORAL | Status: AC
  Filled 2012-01-05: qty 1

## 2012-01-05 NOTE — ED Notes (Signed)
Pt with left thumb injury onset Saturday - hit thumb on refrigerator
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
## 2012-01-05 NOTE — ED Provider Notes (Signed)
Medical screening examination/treatment/procedure(s) were performed by non-physician practitioner and as supervising physician I was immediately available for consultation/collaboration.  Leslee Home, M.D.   Reuben Likes, MD 01/05/12 2056

## 2013-05-20 ENCOUNTER — Other Ambulatory Visit: Payer: Self-pay | Admitting: Obstetrics

## 2013-06-09 NOTE — L&D Delivery Note (Signed)
Delivery Note At 7:58 PM a viable female was delivered via precipitous vaginal delivery prior to CNM arriving in room[facility] Infant vigorous. Placed in warmer. (Presentation: OA).  APGAR: 8, 9; weight 4-4.   Placenta status: spontaneous, intact.  Cord:  3 vessel with the following complications: none.  Cord pH: NA  NICU team called due to IUGR, mild ventriculomegaly, prematurity.   Anesthesia: None  Episiotomy: None Lacerations: None Suture Repair: NA Est. Blood Loss (mL): 450  Mom to postpartum.  Baby to NICU due to low weight. Placenta to: Pathology Feeding: Breast Circ: NA Contraception: BTL  NPO after MN for BTL.   Dorathy KinsmanSMITH, Sherylann Vangorden 08/31/2013, 8:35 PM

## 2013-07-15 ENCOUNTER — Ambulatory Visit (INDEPENDENT_AMBULATORY_CARE_PROVIDER_SITE_OTHER): Payer: Medicaid Other | Admitting: *Deleted

## 2013-07-15 DIAGNOSIS — O0930 Supervision of pregnancy with insufficient antenatal care, unspecified trimester: Secondary | ICD-10-CM

## 2013-07-15 DIAGNOSIS — Z3201 Encounter for pregnancy test, result positive: Secondary | ICD-10-CM

## 2013-07-15 LAB — POCT PREGNANCY, URINE: PREG TEST UR: POSITIVE — AB

## 2013-07-15 NOTE — Progress Notes (Signed)
Pregnancy test performed result is positive. Pt desires to start care here. Unsure of her lmp.

## 2013-07-16 ENCOUNTER — Encounter: Payer: Self-pay | Admitting: Obstetrics & Gynecology

## 2013-07-16 DIAGNOSIS — O9989 Other specified diseases and conditions complicating pregnancy, childbirth and the puerperium: Secondary | ICD-10-CM

## 2013-07-16 DIAGNOSIS — D069 Carcinoma in situ of cervix, unspecified: Secondary | ICD-10-CM | POA: Insufficient documentation

## 2013-07-16 DIAGNOSIS — O09899 Supervision of other high risk pregnancies, unspecified trimester: Secondary | ICD-10-CM | POA: Insufficient documentation

## 2013-07-16 DIAGNOSIS — A6000 Herpesviral infection of urogenital system, unspecified: Secondary | ICD-10-CM | POA: Insufficient documentation

## 2013-07-16 DIAGNOSIS — Z283 Underimmunization status: Secondary | ICD-10-CM | POA: Insufficient documentation

## 2013-07-16 LAB — OBSTETRIC PANEL
Antibody Screen: NEGATIVE
Basophils Absolute: 0 10*3/uL (ref 0.0–0.1)
Basophils Relative: 0 % (ref 0–1)
EOS ABS: 0.1 10*3/uL (ref 0.0–0.7)
EOS PCT: 1 % (ref 0–5)
HCT: 25.3 % — ABNORMAL LOW (ref 36.0–46.0)
HEMOGLOBIN: 8.5 g/dL — AB (ref 12.0–15.0)
HEP B S AG: NEGATIVE
LYMPHS ABS: 2 10*3/uL (ref 0.7–4.0)
Lymphocytes Relative: 29 % (ref 12–46)
MCH: 28.3 pg (ref 26.0–34.0)
MCHC: 33.6 g/dL (ref 30.0–36.0)
MCV: 84.3 fL (ref 78.0–100.0)
MONOS PCT: 9 % (ref 3–12)
Monocytes Absolute: 0.6 10*3/uL (ref 0.1–1.0)
Neutro Abs: 4.2 10*3/uL (ref 1.7–7.7)
Neutrophils Relative %: 61 % (ref 43–77)
Platelets: 179 10*3/uL (ref 150–400)
RBC: 3 MIL/uL — AB (ref 3.87–5.11)
RDW: 14.6 % (ref 11.5–15.5)
RH TYPE: POSITIVE
Rubella: 0.73 Index (ref <0.90)
WBC: 6.9 10*3/uL (ref 4.0–10.5)

## 2013-07-16 LAB — HIV ANTIBODY (ROUTINE TESTING W REFLEX): HIV: NONREACTIVE

## 2013-07-16 LAB — CULTURE, OB URINE
Colony Count: NO GROWTH
Organism ID, Bacteria: NO GROWTH

## 2013-07-19 LAB — PRESCRIPTION MONITORING PROFILE (19 PANEL)
Amphetamine/Meth: NEGATIVE ng/mL
BARBITURATE SCREEN, URINE: NEGATIVE ng/mL
Benzodiazepine Screen, Urine: NEGATIVE ng/mL
Buprenorphine, Urine: NEGATIVE ng/mL
CARISOPRODOL, URINE: NEGATIVE ng/mL
COCAINE METABOLITES: NEGATIVE ng/mL
CREATININE, URINE: 192.81 mg/dL (ref >20.0)
FENTANYL URINE: NEGATIVE ng/mL
MDMA URINE: NEGATIVE ng/mL
MEPERIDINE UR: NEGATIVE ng/mL
METHADONE SCREEN, URINE: NEGATIVE ng/mL
Methaqualone: NEGATIVE ng/mL
Nitrites, Initial: NEGATIVE ug/mL
OPIATE SCREEN, URINE: NEGATIVE ng/mL
OXYCODONE SCRN UR: NEGATIVE ng/mL
PH URINE, INITIAL: 6.7 pH (ref 4.5–8.9)
Phencyclidine, Ur: NEGATIVE ng/mL
Propoxyphene: NEGATIVE ng/mL
TRAMADOL UR: NEGATIVE ng/mL
Tapentadol, urine: NEGATIVE ng/mL
Zolpidem, Urine: NEGATIVE ng/mL

## 2013-07-19 LAB — CANNABANOIDS (GC/LC/MS), URINE: THC-COOH (GC/LC/MS), ur confirm: >1000 ng/mL — AB

## 2013-07-19 LAB — HEMOGLOBINOPATHY EVALUATION
HEMOGLOBIN OTHER: 0 %
HGB A: 97.5 % (ref 96.8–97.8)
Hgb A2 Quant: 2.5 % (ref 2.2–3.2)
Hgb F Quant: 0 % (ref 0.0–2.0)
Hgb S Quant: 0 %

## 2013-07-21 ENCOUNTER — Encounter: Payer: Self-pay | Admitting: Obstetrics & Gynecology

## 2013-07-21 DIAGNOSIS — O99320 Drug use complicating pregnancy, unspecified trimester: Secondary | ICD-10-CM | POA: Insufficient documentation

## 2013-07-21 DIAGNOSIS — F1290 Cannabis use, unspecified, uncomplicated: Secondary | ICD-10-CM

## 2013-07-21 HISTORY — DX: Cannabis use, unspecified, uncomplicated: F12.90

## 2013-07-22 ENCOUNTER — Ambulatory Visit (HOSPITAL_COMMUNITY)
Admission: RE | Admit: 2013-07-22 | Discharge: 2013-07-22 | Disposition: A | Discharge status: Home / Self Care | Payer: Medicaid Other | Source: Ambulatory Visit | Attending: Obstetrics & Gynecology | Admitting: Obstetrics & Gynecology

## 2013-07-22 ENCOUNTER — Other Ambulatory Visit: Payer: Self-pay | Admitting: Obstetrics & Gynecology

## 2013-07-22 ENCOUNTER — Encounter: Payer: Self-pay | Admitting: Obstetrics & Gynecology

## 2013-07-22 DIAGNOSIS — O350XX0 Maternal care for (suspected) central nervous system malformation in fetus, not applicable or unspecified: Secondary | ICD-10-CM | POA: Insufficient documentation

## 2013-07-22 DIAGNOSIS — O3500X0 Maternal care for (suspected) central nervous system malformation or damage in fetus, unspecified, not applicable or unspecified: Secondary | ICD-10-CM | POA: Insufficient documentation

## 2013-07-22 DIAGNOSIS — Z3689 Encounter for other specified antenatal screening: Secondary | ICD-10-CM | POA: Insufficient documentation

## 2013-07-22 DIAGNOSIS — O0930 Supervision of pregnancy with insufficient antenatal care, unspecified trimester: Secondary | ICD-10-CM

## 2013-07-26 ENCOUNTER — Telehealth: Payer: Self-pay | Admitting: *Deleted

## 2013-07-26 NOTE — Telephone Encounter (Signed)
Spoke with patient about appointment, pt verbalizes understanding.

## 2013-07-26 NOTE — Telephone Encounter (Signed)
Message copied by Dorothyann PengHAIZLIP, Prarthana Parlin E on Tue Jul 26, 2013 11:47 AM ------      Message from: Jaynie CollinsANYANWU, UGONNA A      Created: Fri Jul 22, 2013  3:19 PM       10.5 mm lateral ventriculomegaly noted on 4512w2d scan, needs genetic counseling and infection screening.  Scheduled at [facility] on 07/29/13 at 2pm.  Attempted to call patient, unable to reach her on her phone number or leave voicemail. Please call to inform patient of results and recommendations.       ------

## 2013-07-29 ENCOUNTER — Ambulatory Visit (HOSPITAL_COMMUNITY)
Admission: RE | Admit: 2013-07-29 | Discharge: 2013-07-29 | Disposition: A | Discharge status: Home / Self Care | Payer: Medicaid Other | Source: Ambulatory Visit | Attending: Obstetrics & Gynecology | Admitting: Obstetrics & Gynecology

## 2013-07-29 DIAGNOSIS — O3500X0 Maternal care for (suspected) central nervous system malformation or damage in fetus, unspecified, not applicable or unspecified: Secondary | ICD-10-CM

## 2013-07-29 DIAGNOSIS — O0930 Supervision of pregnancy with insufficient antenatal care, unspecified trimester: Secondary | ICD-10-CM

## 2013-07-29 DIAGNOSIS — O3510X0 Maternal care for (suspected) chromosomal abnormality in fetus, unspecified, not applicable or unspecified: Secondary | ICD-10-CM | POA: Insufficient documentation

## 2013-07-29 DIAGNOSIS — IMO0002 Reserved for concepts with insufficient information to code with codable children: Secondary | ICD-10-CM | POA: Insufficient documentation

## 2013-07-29 DIAGNOSIS — O358XX0 Maternal care for other (suspected) fetal abnormality and damage, not applicable or unspecified: Secondary | ICD-10-CM | POA: Insufficient documentation

## 2013-07-29 DIAGNOSIS — O350XX0 Maternal care for (suspected) central nervous system malformation in fetus, not applicable or unspecified: Secondary | ICD-10-CM

## 2013-07-29 DIAGNOSIS — O351XX0 Maternal care for (suspected) chromosomal abnormality in fetus, not applicable or unspecified: Secondary | ICD-10-CM | POA: Insufficient documentation

## 2013-07-29 NOTE — ED Notes (Signed)
Genetic Counseling  Visit Summary Note  Appointment Date: 07/29/2013 Referred By: Tereso Newcomer, MD  Date of Birth: May 12, 1986  Pregnancy history: [id number] Estimated Date of Delivery: 10/03/2013  I met with [name] [name] for genetic counseling because of abnormal ultrasound findings.  We began by reviewing the ultrasound in detail.  We discussed that ultrasound performed last week identified mild ventriculomegaly measuring 10.89mm. The remaining visualized fetal anatomy appeared normal. She understands that ultrasound does not detect or rule out all birth defects prenatally. [name] [name] was counseled that ventriculomegaly refers to an increased measurement of the lateral ventricles in the brain greater than 10 mm. Possible causes for mild ventriculomegaly include overproduction of cerebrospinal fluid, a blockage in a duct causing the fluid to accumulate, an intrauterine infection, or a variation of normal. We discussed that once ventriculomegaly is identified in pregnancy, follow-up ultrasounds are offered to assess for progression of ventriculomegaly. Following delivery, post-natal studies may be required in order to track progress and assess for any other differences in brain anatomy. It was discussed that surgical intervention may be required in some cases of ventriculomegaly. Most cases of isolated mild ventriculomegaly do not have an identified cause or associated condition, tend to regress with time, and have no resulting health or learning problems. However, it is possible that this finding may be seen in association with other ultrasound differences or a genetic condition. There is also a slightly increased chance of developmental delays. We discussed that the presence of ventriculomegaly is associated with an increased risk for aneuploidy, specifically Down syndrome.   We reviewed chromosomes, nondisjunction, and the features and prognosis of Down syndrome. The ultrasound finding of  mild ventriculomegaly would increase the risk for Down syndrome from the patient's age related risk at 28 y.o. years to approximately 1%. She was counseled regarding additional testing and screening options.  We discussed that NIPS (non-invasive prenatal screening) analyzes cell free placental DNA found in the maternal circulation. This test is not diagnostic for chromosome conditions, but can provide information regarding the presence or absence of extra fetal DNA for chromosomes 13, 18, 21, X and Y. Thus, it would not identify or rule out all fetal aneuploidy. We also discussed diagnostic testing via amniocentesis. We reviewed the approximate 1 in 300-500 risk for complications, including spontaneous pregnancy loss or at this late gestation, preterm delivery.  She elected to proceed with NIPS, those results will be available in 10-14 days.   We also discussed Dr. Reeves Forth recommendations for infectious studies. Those were also drawn today.  She was counseled that as some babies with ventriculomegaly require surgery, some families want to meet with a pediatric neurosurgeon prenatally.  At this time, [name] [name] was too overwhelmed to consider this option.  She would like to await the results of these tests and have a follow up ultrasound before deciding further.     Both family histories were reviewed and found to be noncontributory for birth defects, intellectual disability, and known genetic conditions. However, [name] [name] stated that she had no information about the father of the baby's family history.  She said that she does not associate with them.  [name] [name] reported that the father of the baby is disabled and terminally ill.  She stated that he has cancer, has a "bowel for an esophagus" and "everything else you can imagine".  She stated that all of his health issues surround an incident in his childhood where he drank Draino in his soda.  Without further  information regarding the provided family  history, an accurate genetic risk cannot be calculated. Further genetic counseling is warranted if more information is obtained.  [name] [name] was provided with written information regarding sickle cell anemia (SCA) including the carrier frequency and incidence in the African-American population, the availability of carrier testing and prenatal diagnosis if indicated.  In addition, we discussed that hemoglobinopathies are routinely screened for as part of the [place] newborn screening panel.  She was counseled that her hemoglobin electrophoresis was normal.  [name] [name] denied exposure to environmental toxins or chemical agents. She reports using alcohol early in the pregnancy, before she was aware she was pregnant, but would not be more specific than that.  We discussed that alcohol exposure can affect many areas of the developing baby and that there is no safe amount of alcohol.  She reports that she took hydrocodone and oxycodone that a friend gave her, but reported no medical indication for their use.  She reports smoking only 2 cigarettes per day and denies illegal drug use (although her medical records suggests a positive drug screen).  She reports that she was taking Viibryd for depression and stopped it in November.  We discussed that there is no human data to determine what risk, if any, there is to use of this medication in pregnancy.  She denied significant viral illnesses during the course of her pregnancy. Her medical and surgical histories were noncontributory.   [name] [name] expressed that she is terribly overwhelmed with her boyfriend's illness and caring for her two young children and her boyfriend's son from a previous relationship.  She reported that he is currently hospitalized at [facility] and is there almost weekly now.  She stated that she doesn't have time to care for this baby and even less so if it is going to have special needs.  I asked her if she had considered adoption for this baby.  She  stated that she would "never get rid of my baby like that".  She understands that she is too far along for termination to be a legal option in [place], but stated she wouldn't ever consider that either.  She was offered assistance in finding counseling but stated she had a therapist.  We discussed the services available to the family through Hospice, given the terminal illness for her boyfriend.  She was interested in these services and I provided her some literature as well as their contact information.    I counseled Ms. Battin regarding the above risks and available options.  She was scheduled for a follow up ultrasound in 3 weeks.  The approximate face-to-face time with the genetic counselor was 45 minutes.  Cam Hai, MS Certified Genetic Counselor

## 2013-07-30 LAB — CMV ANTIBODY, IGG (EIA): CMV Ab - IgG: <0.2 U/mL (ref <0.60)

## 2013-07-30 LAB — TOXOPLASMA ANTIBODIES- IGG AND  IGM
Toxoplasma Antibody- IgM: <3 IU/mL (ref <8.0)
Toxoplasma IgG Ratio: <3 IU/mL (ref <7.2)

## 2013-07-30 LAB — CMV IGM

## 2013-08-05 ENCOUNTER — Other Ambulatory Visit: Payer: Self-pay | Admitting: Maternal and Fetal Medicine

## 2013-08-09 ENCOUNTER — Telehealth (HOSPITAL_COMMUNITY): Payer: Self-pay | Admitting: Obstetrics and Gynecology

## 2013-08-09 NOTE — Telephone Encounter (Signed)
Called [name] [name] to discuss her cell free fetal DNA test results.  Mrs. [name] [name] had Pan[facility] testing through [facility].  Testing was offered because of fetal ventriculomegaly.   The patient was identified by name and DOB.  We reviewed that these are within normal limits, showing a less than 1 in 10,000 risk for trisomies 21, 18 and 13, and monosomy X (Turner syndrome).  In addition, the risk for triploidy/vanishing twin and sex chromosome trisomies (47,XXX and 47,XXY) was also low risk.    We reviewed that this testing identifies > 99% of pregnancies with trisomy 7421, trisomy 4413, sex chromosome trisomies (47,XXX and 47,XXY), and triploidy. The detection rate for trisomy 18 is 96%.  The detection rate for monosomy X is ~92%.  The false positive rate is <0.1% for all conditions. Testing was also consistent with female.  She understands that this testing does not identify all genetic conditions.  All questions were answered to her satisfaction, she was encouraged to call with additional questions or concerns.  [name] Gemmaaragh Conrad, MS Certified Genetic Counselor

## 2013-08-16 ENCOUNTER — Other Ambulatory Visit: Payer: Self-pay | Admitting: Family Medicine

## 2013-08-16 DIAGNOSIS — O0930 Supervision of pregnancy with insufficient antenatal care, unspecified trimester: Secondary | ICD-10-CM

## 2013-08-16 DIAGNOSIS — IMO0002 Reserved for concepts with insufficient information to code with codable children: Secondary | ICD-10-CM

## 2013-08-19 ENCOUNTER — Ambulatory Visit (HOSPITAL_COMMUNITY)
Admission: RE | Admit: 2013-08-19 | Discharge: 2013-08-19 | Disposition: A | Discharge status: Home / Self Care | Payer: Medicaid Other | Source: Ambulatory Visit | Attending: Obstetrics | Admitting: Obstetrics

## 2013-08-19 ENCOUNTER — Other Ambulatory Visit: Payer: Self-pay | Admitting: Family Medicine

## 2013-08-19 ENCOUNTER — Encounter (HOSPITAL_COMMUNITY): Payer: Self-pay

## 2013-08-19 DIAGNOSIS — O350XX0 Maternal care for (suspected) central nervous system malformation in fetus, not applicable or unspecified: Secondary | ICD-10-CM | POA: Insufficient documentation

## 2013-08-19 DIAGNOSIS — IMO0002 Reserved for concepts with insufficient information to code with codable children: Secondary | ICD-10-CM

## 2013-08-19 DIAGNOSIS — O3500X0 Maternal care for (suspected) central nervous system malformation or damage in fetus, unspecified, not applicable or unspecified: Secondary | ICD-10-CM | POA: Insufficient documentation

## 2013-08-19 DIAGNOSIS — O0930 Supervision of pregnancy with insufficient antenatal care, unspecified trimester: Secondary | ICD-10-CM

## 2013-08-21 ENCOUNTER — Encounter: Payer: Self-pay | Admitting: Family Medicine

## 2013-08-23 ENCOUNTER — Other Ambulatory Visit (HOSPITAL_COMMUNITY): Admission: RE | Admit: 2013-08-23 | Payer: Medicaid Other | Source: Ambulatory Visit | Admitting: Family Medicine

## 2013-08-23 ENCOUNTER — Ambulatory Visit (INDEPENDENT_AMBULATORY_CARE_PROVIDER_SITE_OTHER): Payer: Medicaid Other | Admitting: Family Medicine

## 2013-08-23 ENCOUNTER — Other Ambulatory Visit (HOSPITAL_COMMUNITY)
Admission: RE | Admit: 2013-08-23 | Discharge: 2013-08-23 | Disposition: A | Discharge status: Home / Self Care | Payer: Medicaid Other | Source: Ambulatory Visit | Attending: Family Medicine | Admitting: Family Medicine

## 2013-08-23 ENCOUNTER — Other Ambulatory Visit: Payer: Self-pay | Admitting: Family Medicine

## 2013-08-23 ENCOUNTER — Encounter: Payer: Self-pay | Admitting: Family Medicine

## 2013-08-23 VITALS — BP 113/73 | Temp 97.6°F | Wt 134.5 lb

## 2013-08-23 DIAGNOSIS — O3500X0 Maternal care for (suspected) central nervous system malformation or damage in fetus, unspecified, not applicable or unspecified: Secondary | ICD-10-CM

## 2013-08-23 DIAGNOSIS — O9989 Other specified diseases and conditions complicating pregnancy, childbirth and the puerperium: Secondary | ICD-10-CM

## 2013-08-23 DIAGNOSIS — O350XX0 Maternal care for (suspected) central nervous system malformation in fetus, not applicable or unspecified: Secondary | ICD-10-CM

## 2013-08-23 DIAGNOSIS — D069 Carcinoma in situ of cervix, unspecified: Secondary | ICD-10-CM

## 2013-08-23 DIAGNOSIS — O0930 Supervision of pregnancy with insufficient antenatal care, unspecified trimester: Secondary | ICD-10-CM

## 2013-08-23 DIAGNOSIS — F1910 Other psychoactive substance abuse, uncomplicated: Secondary | ICD-10-CM

## 2013-08-23 DIAGNOSIS — Z23 Encounter for immunization: Secondary | ICD-10-CM

## 2013-08-23 DIAGNOSIS — Z3480 Encounter for supervision of other normal pregnancy, unspecified trimester: Secondary | ICD-10-CM

## 2013-08-23 DIAGNOSIS — Z113 Encounter for screening for infections with a predominantly sexual mode of transmission: Secondary | ICD-10-CM | POA: Insufficient documentation

## 2013-08-23 DIAGNOSIS — O99340 Other mental disorders complicating pregnancy, unspecified trimester: Secondary | ICD-10-CM

## 2013-08-23 DIAGNOSIS — A6000 Herpesviral infection of urogenital system, unspecified: Secondary | ICD-10-CM

## 2013-08-23 DIAGNOSIS — Z01419 Encounter for gynecological examination (general) (routine) without abnormal findings: Secondary | ICD-10-CM

## 2013-08-23 DIAGNOSIS — O09899 Supervision of other high risk pregnancies, unspecified trimester: Secondary | ICD-10-CM

## 2013-08-23 DIAGNOSIS — Z3490 Encounter for supervision of normal pregnancy, unspecified, unspecified trimester: Secondary | ICD-10-CM

## 2013-08-23 DIAGNOSIS — Z2839 Other underimmunization status: Secondary | ICD-10-CM

## 2013-08-23 DIAGNOSIS — Z283 Underimmunization status: Secondary | ICD-10-CM

## 2013-08-23 DIAGNOSIS — O99320 Drug use complicating pregnancy, unspecified trimester: Secondary | ICD-10-CM

## 2013-08-23 LAB — POCT URINALYSIS DIP (DEVICE)
BILIRUBIN URINE: NEGATIVE
Glucose, UA: NEGATIVE mg/dL
Hgb urine dipstick: NEGATIVE
KETONES UR: NEGATIVE mg/dL
Leukocytes, UA: NEGATIVE
Nitrite: NEGATIVE
PH: 7.5 (ref 5.0–8.0)
Protein, ur: 30 mg/dL — AB
Specific Gravity, Urine: 1.02 (ref 1.005–1.030)
Urobilinogen, UA: 0.2 mg/dL (ref 0.0–1.0)

## 2013-08-23 MED ORDER — PRENATAL VITAMINS PLUS 27-1 MG PO TABS: 1.0000 | ORAL_TABLET | Freq: Every day | ORAL | Status: DC

## 2013-08-23 MED ORDER — TETANUS-DIPHTH-ACELL PERTUSSIS 5-2.5-18.5 LF-MCG/0.5 IM SUSP: 0.5000 mL | Freq: Once | INTRAMUSCULAR | Status: DC

## 2013-08-23 NOTE — Progress Notes (Signed)
P=90,Here for initial visit. States was on birth control pills when got pregnant. Feels like having braxton hicks painless contractions often this week like baby balling up , pressure. Given new patient information.  Discussed bmi/ appropriate weight gain.

## 2013-08-23 NOTE — Progress Notes (Signed)
Subjective:    [name] [name] is a [id number] 10984w6d being seen today for her first obstetrical visit.  Pt states that she was seen in the ED initially and got referred for US.  Since initial US was found to have lateral ventriculomegaly at 22 weeks and then now seems to be improved. On last scan 08/18/13 was noted to have normal ventricles but an EFW 15%ile and AC<3rd%ile.  UA dopplers were increased for GA but no absent or reversed flow. Now having weekly dopplers.     Patient does intend to breast feed. Pregnancy history fully reviewed.  Patient reports no complaints.  Filed Vitals:   08/23/13 1259  BP: 113/73  Temp: 97.6 F (36.4 C)  Weight: 134 lb 8 oz (61.009 kg)    HISTORY: OB History  Gravida Para Term Preterm AB SAB TAB Ectopic Multiple Living  3 2 2  0 0 0 0 0 0 2    # Outcome Date GA Lbr Len/2nd Weight Sex Delivery Anes PTL Lv  3 CUR           2 TRM 01/18/11 3116w4d 04:10 / 00:52 6 lb 14 oz (3.118 kg) M SVD None  Y  1 TRM 03/2007     SVD        Past Medical History  Diagnosis Date  . CIN III (cervical intraepithelial neoplasia grade III) with severe dysplasia     2009  . Genital HSV   . Scoliosis   . Morphea   . Marijuana use 07/21/2013    Positive initial urine drug screen this pregnancy    History reviewed. No pertinent past surgical history. Family History  Problem Relation Age of Onset  . Diabetes Other   . Hypertension Other      Exam    Uterus:     Pelvic Exam:    Perineum: No Hemorrhoids   Vulva: normal   Vagina:  normal mucosa, normal discharge   Cervix: no cervical motion tenderness and no lesions   Adnexa: normal adnexa and no mass, fullness, tenderness  System: Breast:  deferred   Skin: normal coloration and turgor, no rashes    Neurologic: oriented, normal   Extremities: normal strength, tone, and muscle mass, no deformities   HEENT PERRLA and extra ocular movement intact   Mouth/Teeth mucous membranes moist, pharynx normal without  lesions   Neck supple   Cardiovascular: regular rate and rhythm, no murmurs or gallops   Respiratory:  appears well, vitals normal, no respiratory distress, acyanotic, normal RR, chest clear, no wheezing, crepitations, rhonchi, normal symmetric air entry   Abdomen: soft, non-tender; bowel sounds normal; no masses,  no organomegaly   Urinary: urethral meatus normal      Assessment:    Pregnancy: [id number] Patient Active Problem List   Diagnosis Date Noted  . Mild cerebral ventriculomegaly, antepartum 07/22/2013  . Maternal marijuana use complicating pregnancy, antepartum 07/21/2013  . Rubella non-immune status, antepartum 07/16/2013  . 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 Severe dysplasia of cervix (CIN III) on colposcopy biopsy in 2009 07/16/2013  . Supervision of normal pregnancy 01/18/2011        Plan:     Initial labs reviewed. Prenatal vitamins discussed Problem list reviewed and updated. Cont weekly dopplers and will need growth in 3 weeks.  Desires pp BTL- paper signed today PAP done today   Follow up in 2 weeks. 50% of 30 min visit spent on counseling and coordination of care.     Terria Deschepper L  08/23/2013   

## 2013-08-24 ENCOUNTER — Ambulatory Visit (HOSPITAL_COMMUNITY)
Admission: RE | Admit: 2013-08-24 | Discharge: 2013-08-24 | Disposition: A | Discharge status: Home / Self Care | Payer: Medicaid Other | Source: Ambulatory Visit | Attending: Obstetrics | Admitting: Obstetrics

## 2013-08-24 ENCOUNTER — Other Ambulatory Visit: Payer: Self-pay | Admitting: Family Medicine

## 2013-08-24 ENCOUNTER — Encounter (HOSPITAL_COMMUNITY): Payer: Self-pay | Admitting: *Deleted

## 2013-08-24 ENCOUNTER — Inpatient Hospital Stay (HOSPITAL_COMMUNITY)
Admission: AD | Admit: 2013-08-24 | Discharge: 2013-09-02 | DRG: 767 | Disposition: A | Discharge status: Home / Self Care | Payer: Medicaid Other | Source: Ambulatory Visit | Attending: Family Medicine | Admitting: Family Medicine

## 2013-08-24 DIAGNOSIS — O358XX0 Maternal care for other (suspected) fetal abnormality and damage, not applicable or unspecified: Secondary | ICD-10-CM | POA: Diagnosis present

## 2013-08-24 DIAGNOSIS — O99344 Other mental disorders complicating childbirth: Secondary | ICD-10-CM | POA: Diagnosis present

## 2013-08-24 DIAGNOSIS — O321XX0 Maternal care for breech presentation, not applicable or unspecified: Secondary | ICD-10-CM | POA: Diagnosis present

## 2013-08-24 DIAGNOSIS — O365990 Maternal care for other known or suspected poor fetal growth, unspecified trimester, not applicable or unspecified: Secondary | ICD-10-CM | POA: Diagnosis present

## 2013-08-24 DIAGNOSIS — O98519 Other viral diseases complicating pregnancy, unspecified trimester: Secondary | ICD-10-CM | POA: Diagnosis present

## 2013-08-24 DIAGNOSIS — F1210 Cannabis abuse, uncomplicated: Secondary | ICD-10-CM | POA: Diagnosis present

## 2013-08-24 DIAGNOSIS — B069 Rubella without complication: Secondary | ICD-10-CM | POA: Diagnosis present

## 2013-08-24 DIAGNOSIS — O99334 Smoking (tobacco) complicating childbirth: Secondary | ICD-10-CM | POA: Diagnosis present

## 2013-08-24 DIAGNOSIS — O0930 Supervision of pregnancy with insufficient antenatal care, unspecified trimester: Secondary | ICD-10-CM

## 2013-08-24 DIAGNOSIS — O9903 Anemia complicating the puerperium: Secondary | ICD-10-CM | POA: Diagnosis present

## 2013-08-24 DIAGNOSIS — O350XX0 Maternal care for (suspected) central nervous system malformation in fetus, not applicable or unspecified: Secondary | ICD-10-CM

## 2013-08-24 DIAGNOSIS — D649 Anemia, unspecified: Secondary | ICD-10-CM | POA: Diagnosis present

## 2013-08-24 DIAGNOSIS — O368390 Maternal care for abnormalities of the fetal heart rate or rhythm, unspecified trimester, not applicable or unspecified: Secondary | ICD-10-CM | POA: Diagnosis present

## 2013-08-24 DIAGNOSIS — O3500X0 Maternal care for (suspected) central nervous system malformation or damage in fetus, unspecified, not applicable or unspecified: Secondary | ICD-10-CM

## 2013-08-24 DIAGNOSIS — A6000 Herpesviral infection of urogenital system, unspecified: Secondary | ICD-10-CM | POA: Diagnosis present

## 2013-08-24 DIAGNOSIS — O0940 Supervision of pregnancy with grand multiparity, unspecified trimester: Secondary | ICD-10-CM

## 2013-08-24 DIAGNOSIS — Z302 Encounter for sterilization: Secondary | ICD-10-CM | POA: Diagnosis not present

## 2013-08-24 DIAGNOSIS — O9989 Other specified diseases and conditions complicating pregnancy, childbirth and the puerperium: Secondary | ICD-10-CM

## 2013-08-24 LAB — RAPID URINE DRUG SCREEN, HOSP PERFORMED
Amphetamines: NOT DETECTED
BARBITURATES: NOT DETECTED
Benzodiazepines: NOT DETECTED
COCAINE: NOT DETECTED
OPIATES: NOT DETECTED
Tetrahydrocannabinol: POSITIVE — AB

## 2013-08-24 LAB — CBC
HCT: 25.6 % — ABNORMAL LOW (ref 36.0–46.0)
Hemoglobin: 8.7 g/dL — ABNORMAL LOW (ref 12.0–15.0)
MCH: 28.9 pg (ref 26.0–34.0)
MCHC: 34 g/dL (ref 30.0–36.0)
MCV: 85 fL (ref 78.0–100.0)
Platelets: 137 10*3/uL — ABNORMAL LOW (ref 150–400)
RBC: 3.01 MIL/uL — ABNORMAL LOW (ref 3.87–5.11)
RDW: 14 % (ref 11.5–15.5)
WBC: 6.2 10*3/uL (ref 4.0–10.5)

## 2013-08-24 LAB — TYPE AND SCREEN
ABO/RH(D): O POS
Antibody Screen: NEGATIVE

## 2013-08-24 LAB — GLUCOSE TOLERANCE, 1 HOUR (50G) W/O FASTING: Glucose, 1 Hour GTT: 94 mg/dL (ref 70–140)

## 2013-08-24 LAB — OB RESULTS CONSOLE GBS: GBS: NEGATIVE

## 2013-08-24 LAB — OB RESULTS CONSOLE GC/CHLAMYDIA
Chlamydia: NEGATIVE
Gonorrhea: NEGATIVE

## 2013-08-24 LAB — ABO/RH: ABO/RH(D): O POS

## 2013-08-24 MED ORDER — ACETAMINOPHEN 325 MG PO TABS
650.0000 mg | ORAL_TABLET | ORAL | Status: DC | PRN
  Administered 2013-08-26 – 2013-08-30 (×3): 650 mg via ORAL
  Filled 2013-08-24 (×3): qty 2

## 2013-08-24 MED ORDER — DOCUSATE SODIUM 100 MG PO CAPS
100.0000 mg | ORAL_CAPSULE | Freq: Every day | ORAL | Status: DC
  Administered 2013-08-25 – 2013-08-31 (×6): 100 mg via ORAL
  Filled 2013-08-24 (×6): qty 1

## 2013-08-24 MED ORDER — FERUMOXYTOL INJECTION 510 MG/17 ML
510.0000 mg | Freq: Once | INTRAVENOUS | Status: AC
  Administered 2013-08-24: 510 mg via INTRAVENOUS
  Filled 2013-08-24: qty 17

## 2013-08-24 MED ORDER — PRENATAL MULTIVITAMIN CH
1.0000 | ORAL_TABLET | Freq: Every day | ORAL | Status: DC
  Administered 2013-08-25 – 2013-08-29 (×5): 1 via ORAL
  Filled 2013-08-24 (×5): qty 1

## 2013-08-24 MED ORDER — ZOLPIDEM TARTRATE 5 MG PO TABS
5.0000 mg | ORAL_TABLET | Freq: Every evening | ORAL | Status: DC | PRN
  Administered 2013-08-26: 5 mg via ORAL
  Filled 2013-08-24: qty 1

## 2013-08-24 MED ORDER — BETAMETHASONE SOD PHOS & ACET 6 (3-3) MG/ML IJ SUSP
12.0000 mg | INTRAMUSCULAR | Status: AC
  Administered 2013-08-24 – 2013-08-25 (×2): 12 mg via INTRAMUSCULAR
  Filled 2013-08-24 (×2): qty 2

## 2013-08-24 MED ORDER — CALCIUM CARBONATE ANTACID 500 MG PO CHEW
2.0000 | CHEWABLE_TABLET | ORAL | Status: DC | PRN
  Administered 2013-08-25 – 2013-08-29 (×8): 400 mg via ORAL
  Filled 2013-08-24 (×10): qty 1

## 2013-08-24 NOTE — Progress Notes (Signed)
UR completed 

## 2013-08-24 NOTE — Plan of Care (Signed)
Problem: Consults Goal: Birthing Suites Patient Information Press F2 to bring up selections list Outcome: Completed/Met Date Met:  08/24/13  Pt < [redacted] weeks EGA

## 2013-08-24 NOTE — ED Notes (Signed)
Pt taken to room 156 for monitoring.  Report to RN.
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
## 2013-08-25 ENCOUNTER — Inpatient Hospital Stay (HOSPITAL_COMMUNITY): Payer: Medicaid Other

## 2013-08-25 NOTE — Progress Notes (Signed)
Patient ID: [name] [name], female   DOB: 1986/01/25, 28 y.o.   MRN: [id number] Estimated Date of Delivery: 10/05/13 244w1d G3P2002 [facility] Ad[facility] after antepartum testing revealing a deceleration with elevated Dopplers in a fetus with <3% AC, overall EFW 15% BPP 8/8  Pt reports good fetal movement and continuouis monitoring does not reveal any other deceleration episodes No bleeding  No contractions  No LOF  Blood pressure 116/50, pulse 92, temperature 98.4 F (36.9 C), temperature source Oral, resp. rate 16, height 5\' 9"  (1.753 m), weight 134 lb 8 oz (61.009 kg). General well appearing pregnant female in no distress Abdomen soft non tender  Results for orders placed during the hospital encounter of 08/24/13 (from the past 24 hour(s))  OB RESULTS CONSOLE GBS   Collection Time    08/24/13  2:11 PM      Result Value Ref Range   GBS Negative    OB RESULTS CONSOLE GC/CHLAMYDIA   Collection Time    08/24/13  2:11 PM      Result Value Ref Range   Gonorrhea Negative     Chlamydia Negative    URINE RAPID DRUG SCREEN (HOSP PERFORMED)   Collection Time    08/24/13  2:20 PM      Result Value Ref Range   Opiates NONE DETECTED  NONE DETECTED   Cocaine NONE DETECTED  NONE DETECTED   Benzodiazepines NONE DETECTED  NONE DETECTED   Amphetamines NONE DETECTED  NONE DETECTED   Tetrahydrocannabinol POSITIVE (*) NONE DETECTED   Barbiturates NONE DETECTED  NONE DETECTED  CBC   Collection Time    08/24/13  2:40 PM      Result Value Ref Range   WBC 6.2  4.0 - 10.5 K/uL   RBC 3.01 (*) 3.87 - 5.11 MIL/uL   Hemoglobin 8.7 (*) 12.0 - 15.0 g/dL   HCT 47.425.6 (*) 25.936.0 - 56.346.0 %   MCV 85.0  78.0 - 100.0 fL   MCH 28.9  26.0 - 34.0 pg   MCHC 34.0  30.0 - 36.0 g/dL   RDW 87.514.0  64.311.5 - 32.915.5 %   Platelets 137 (*) 150 - 400 K/uL  TYPE AND SCREEN   Collection Time    08/24/13  2:40 PM      Result Value Ref Range   ABO/RH(D) O POS     Antibody Screen NEG     Sample Expiration 08/27/2013     ABO/RH   Collection Time    08/24/13  2:40 PM      Result Value Ref Range   ABO/RH(D) O POS     Assessment: Estimated Date of Delivery: 10/05/13 748w1d G3P2002  Abnormal fetal growth Elevated Doppler flow studies Anemia  Plan: Repeat fetal assessment today with Dopplers S/P IV iron

## 2013-08-25 NOTE — Progress Notes (Signed)
Dr Debroah LoopArnold notified of results from MFM report this AM: BPP and Dopplers. Also notified of FHR tracing with occassional decelerations, UCs, pain level, last BMZ dose and shared recommendations for POC.
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
## 2013-08-25 NOTE — Progress Notes (Signed)
Patient ID: [name] [name], female   DOB: August 23, 1985, 28 y.o.   MRN: [id number] FACULTY PRACTICE ANTEPARTUM(COMPREHENSIVE) NOTE  [name] [name] is a 28 y.o. G3P2002 at [redacted]w[redacted]d by midtrimester ultrasound who is admitted for monitoring with abnormal BPP and dopplers.   Fetal presentation is cephalic. Length of Stay:  1  Days  Subjective: Occasional contractions  Patient reports the fetal movement as active. Patient reports uterine contraction  activity as irregular. Patient reports  vaginal bleeding as none. Patient describes fluid per vagina as None.  Vitals:  Blood pressure 118/56, pulse 100, temperature 97.9 F (36.6 C), temperature source Oral, resp. rate 16, height 5\' 9"  (1.753 m), weight 134 lb 8 oz (61.009 kg). Physical Examination:  General appearance - alert, well appearing, and in no distress Heart - normal rate and regular rhythm Abdomen - soft, nontender, nondistended Fundal Height:  size less than dates Cervical Exam: Not evaluated.. Extremities: extremities normal, atraumatic, no cyanosis or edema and Homans sign is negative, no sign of DVT with DTRs 2+ bilaterally Membranes:intact  Fetal Monitoring:  Baseline: 135 bpm, Variability: Good {> 6 bpm), Accelerations: Reactive and Decelerations: rare variable  Labs:  Results for orders placed during the hospital encounter of 08/24/13 (from the past 24 hour(s))  OB RESULTS CONSOLE GBS   Collection Time    08/24/13  2:11 PM      Result Value Ref Range   GBS Negative    OB RESULTS CONSOLE GC/CHLAMYDIA   Collection Time    08/24/13  2:11 PM      Result Value Ref Range   Gonorrhea Negative     Chlamydia Negative    URINE RAPID DRUG SCREEN (HOSP PERFORMED)   Collection Time    08/24/13  2:20 PM      Result Value Ref Range   Opiates NONE DETECTED  NONE DETECTED   Cocaine NONE DETECTED  NONE DETECTED   Benzodiazepines NONE DETECTED  NONE DETECTED   Amphetamines NONE DETECTED  NONE DETECTED   Tetrahydrocannabinol  POSITIVE (*) NONE DETECTED   Barbiturates NONE DETECTED  NONE DETECTED  CBC   Collection Time    08/24/13  2:40 PM      Result Value Ref Range   WBC 6.2  4.0 - 10.5 K/uL   RBC 3.01 (*) 3.87 - 5.11 MIL/uL   Hemoglobin 8.7 (*) 12.0 - 15.0 g/dL   HCT 40.9 (*) 81.1 - 91.4 %   MCV 85.0  78.0 - 100.0 fL   MCH 28.9  26.0 - 34.0 pg   MCHC 34.0  30.0 - 36.0 g/dL   RDW 78.2  95.6 - 21.3 %   Platelets 137 (*) 150 - 400 K/uL  TYPE AND SCREEN   Collection Time    08/24/13  2:40 PM      Result Value Ref Range   ABO/RH(D) O POS     Antibody Screen NEG     Sample Expiration 08/27/2013    ABO/RH   Collection Time    08/24/13  2:40 PM      Result Value Ref Range   ABO/RH(D) O POS      Imaging Studies:      Currently EPIC will not allow sonographic studies to automatically populate into notes.  In the meantime, copy and paste results into note or free text. [place] BPP today reviewed, 6/8, doppler elevated Medications:  Scheduled . betamethasone acetate-betamethasone sodium phosphate  12 mg Intramuscular Q24 Hr x 2  . docusate sodium  100  mg Oral Daily  . prenatal multivitamin  1 tablet Oral Q1200   I have reviewed the patient's current medications.  ASSESSMENT: Patient Active Problem List   Diagnosis Date Noted  . IUGR, antenatal 08/24/2013  . Mild cerebral ventriculomegaly, antepartum 07/22/2013  . Maternal marijuana use complicating pregnancy, antepartum 07/21/2013  . Rubella non-immune status, antepartum 07/16/2013  . 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 Severe dysplasia of cervix (CIN III) on colposcopy biopsy in 2009 07/16/2013  . Supervision of normal pregnancy 01/18/2011    PLAN: If tracing not reassuring then plan induction of labor.   ARNOLD,JAMES 08/25/2013,11:53 AM

## 2013-08-26 ENCOUNTER — Inpatient Hospital Stay (HOSPITAL_COMMUNITY): Payer: Medicaid Other

## 2013-08-26 LAB — CANNABANOIDS (GC/LC/MS), URINE: THC-COOH (GC/LC/MS), ur confirm: >1000 ng/mL — AB

## 2013-08-26 LAB — PRESCRIPTION MONITORING PROFILE (19 PANEL)
Amphetamine/Meth: NEGATIVE ng/mL
BARBITURATE SCREEN, URINE: NEGATIVE ng/mL
BENZODIAZEPINE SCREEN, URINE: NEGATIVE ng/mL
BUPRENORPHINE, URINE: NEGATIVE ng/mL
COCAINE METABOLITES: NEGATIVE ng/mL
Carisoprodol, Urine: NEGATIVE ng/mL
Creatinine, Urine: 149.23 mg/dL (ref >20.0)
FENTANYL URINE: NEGATIVE ng/mL
MDMA URINE: NEGATIVE ng/mL
Meperidine, Ur: NEGATIVE ng/mL
Methadone Screen, Urine: NEGATIVE ng/mL
Methaqualone: NEGATIVE ng/mL
Nitrites, Initial: NEGATIVE ug/mL
OPIATE SCREEN, URINE: NEGATIVE ng/mL
Oxycodone Screen, Ur: NEGATIVE ng/mL
PHENCYCLIDINE, UR: NEGATIVE ng/mL
Propoxyphene: NEGATIVE ng/mL
Tapentadol, urine: NEGATIVE ng/mL
Tramadol Scrn, Ur: NEGATIVE ng/mL
ZOLPIDEM, URINE: NEGATIVE ng/mL
pH, Initial: 7.7 pH (ref 4.5–8.9)

## 2013-08-26 LAB — CULTURE, OB URINE: Colony Count: >=100000

## 2013-08-26 MED ORDER — AMOXICILLIN 250 MG/5ML PO SUSR
500.0000 mg | Freq: Three times a day (TID) | ORAL | Status: DC
  Administered 2013-08-26 – 2013-09-02 (×18): 500 mg via ORAL
  Filled 2013-08-26 (×23): qty 10

## 2013-08-26 MED ORDER — AMOXICILLIN 500 MG PO CAPS
500.0000 mg | ORAL_CAPSULE | Freq: Three times a day (TID) | ORAL | Status: DC
  Filled 2013-08-26 (×4): qty 1

## 2013-08-26 NOTE — Progress Notes (Signed)
Patient ID: [name] [name], female   DOB: [birth year], 28 y.o.   MRN: [id number] FACULTY PRACTICE ANTEPARTUM(COMPREHENSIVE) NOTE  [name] [name] is a 28 y.o. G3P2002 at 6878w2d by third trimester US who is admitted for monitoring due to abnormal growth and dopplers.   Fetal presentation is cephalic. Length of Stay:  2  Days  Subjective: Few contractions Patient reports the fetal movement as active. Patient reports uterine contraction  activity as irregular. Patient reports  vaginal bleeding as none. Patient describes fluid per vagina as None.  Vitals:  Blood pressure 107/48, pulse 85, temperature 98.4 F (36.9 C), temperature source Oral, resp. rate 18, height 5\' 9"  (1.753 m), weight 134 lb 8 oz (61.009 kg). Physical Examination:  General appearance - alert, well appearing, and in no distress Heart - normal rate and regular rhythm Abdomen - soft, nontender, nondistended Fundal Height:  size less than dates Cervical Exam: Evaluated by digital exam., Position: posterior, Dilation: 0cm, Thickness: 2 cm and Consistency: soft and found to be 0cm/ 40 /-3 and fetal presentation is cephalic. Extremities: extremities normal, atraumatic, no cyanosis or edema and Homans sign is negative,  Membranes:intact  Fetal Monitoring:  Baseline: 140 bpm, Variability: Good {> 6 bpm), Accelerations: Reactive and Decelerations: Variable: mild  Labs:  No results found for this or any previous visit (from the past 24 hour(s)).  Imaging Studies:      Currently EPIC will not allow sonographic studies to automatically populate into notes.  In the meantime, copy and paste results into note or free text.  Medications:  Scheduled . docusate sodium  100 mg Oral Daily  . prenatal multivitamin  1 tablet Oral Q1200   I have reviewed the patient's current medications.  ASSESSMENT: Patient Active Problem List   Diagnosis Date Noted  . IUGR, antenatal 08/24/2013  . Mild cerebral ventriculomegaly, antepartum  07/22/2013  . Maternal marijuana use complicating pregnancy, antepartum 07/21/2013  . Rubella non-immune status, antepartum 07/16/2013  . 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 Severe dysplasia of cervix (CIN III) on colposcopy biopsy in 2009 07/16/2013  . Supervision of normal pregnancy 01/18/2011    PLAN: Continuous monitoring and plan delivery if surveillance not reassuring  ARNOLD,JAMES 08/26/2013,6:47 AM
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
## 2013-08-26 NOTE — Progress Notes (Signed)
Chaplains consulted with patient's RN and CSW throughout the day. CSW was speaking with patient while chaplain was rounding on unit. Chaplains will continue to monitor, but please page if needed.   Guy SandiferHillary D [place], [place]plain [phone]

## 2013-08-27 MED ORDER — ONDANSETRON 4 MG PO TBDP
4.0000 mg | ORAL_TABLET | Freq: Once | ORAL | Status: AC
Start: 2013-08-27 — End: 2013-08-27
  Administered 2013-08-27: 4 mg via ORAL
  Filled 2013-08-27: qty 1

## 2013-08-27 NOTE — Progress Notes (Signed)
Patient ID: [name] [name], female   DOB: 05/05/1986, 28 y.o.   MRN: [id number] FACULTY PRACTICE ANTEPARTUM(COMPREHENSIVE) NOTE  [name] [name] is a 28 y.o. G3P2002 at 789w3d who is admitted 3/18 for SGA,noted at admit to have BPP6/8 with spont decels, now stable, WITH Friday 3/20   BPP 8/8 with good af vol, and no spont decels, now having completed BMZ x 2, and on continuous monitoring.  Fetal presentation is cephalic. Length of Stay:  3  Days  Subjective: Pt resting comfortably , feels vigorous fetal mvmt daily, no bleeding or contractions at present Patient reports the fetal movement as active. Patient reports uterine contraction  activity as none. Patient reports  vaginal bleeding as none. Patient describes fluid per vagina as None.  Vitals:  Blood pressure 99/52, pulse 92, temperature 98.1 F (36.7 C), temperature source Oral, resp. rate 18, height 5\' 9"  (1.753 m), weight 61.009 kg (134 lb 8 oz). Physical Examination:  General appearance - alert, well appearing, and in no distress, oriented to person, place, and time and normal appearing weight Heart - normal rate and regular rhythm Abdomen - soft, nontender, nondistended Fundal Height:  size less than dates Cervical Exam: Not evaluated. nd fetal presentation is cephalic. Extremities: extremities normal, atraumatic, no cyanosis or edema and Homans sign is negative, no sign of DVT with DTRs 2+ bilaterally Membranes:intact  Fetal Monitoring:  Baseline: 150 bpm, Variability: Fair (1-6 bpm), Accelerations: Non-reactive but appropriate for gestational age and Decelerations: Absent  Labs:  No results found for this or any previous visit (from the past 24 hour(s)).  Imaging Studies:     Currently EPIC will not allow sonographic studies to automatically populate into notes.  In the meantime, copy and paste results into note or free text.  Medications:  Scheduled . amoxicillin  500 mg Oral 3 times per day  . docusate sodium  100 mg  Oral Daily  . prenatal multivitamin  1 tablet Oral Q1200   I have reviewed the patient's current medications.  ASSESSMENT: Patient Active Problem List   Diagnosis Date Noted  . IUGR, antenatal 08/24/2013  . Mild cerebral ventriculomegaly, antepartum 07/22/2013  . Maternal marijuana use complicating pregnancy, antepartum 07/21/2013  . Rubella non-immune status, antepartum 07/16/2013  . 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 Severe dysplasia of cervix (CIN III) on colposcopy biopsy in 2009 07/16/2013  . Supervision of normal pregnancy 01/18/2011    PLAN: Continuous efm,  Repeat dopplers Monday Will need growth scan next wk Deliver at recurrence of spont decels.  [name] [name] 08/27/2013,7:39 AM

## 2013-08-28 ENCOUNTER — Encounter (HOSPITAL_COMMUNITY): Payer: Self-pay | Admitting: Obstetrics and Gynecology

## 2013-08-28 NOTE — Progress Notes (Signed)
FACULTY PRACTICE ANTEPARTUM(COMPREHENSIVE) NOTE  [name] [name] is a 28 y.o. G3P2002 at 1468w4d who is admitted for SGA, symmetric growth. The patient had a prolonged decel during dopplers on day of admisision and first night. For the last 2 nights, there  Have been normal monitorig by continuous monitoring with good Cat I EFM .   She had a long strip of contrations last night with Fhr cat I throughout the night. Fetal presentation is cephalic. Length of Stay:  4  Days  Subjective: Pt reports baby is active Patient reports the fetal movement as active. Patient reports uterine contraction  activity as none now, brief episode of spont contrn  9 pm without decels Patient reports  vaginal bleeding as none. Patient describes fluid per vagina as None.  Vitals:  Blood pressure 93/47, pulse 93, temperature 98.4 F (36.9 C), temperature source Oral, resp. rate 18, height 5\' 9"  (1.753 m), weight 61.009 kg (134 lb 8 oz). Physical Examination:  General appearance - alert, well appearing, and in no distress Heart - normal rate and regular rhythm Abdomen - soft, nontender, nondistended Fundal Height:  29-30 cm Cervical Exam: Not evaluated.  fetal presentation is cephalic and by last u/s. Extremities: extremities normal, atraumatic, no cyanosis or edema and Homans sign is negative, no sign of DVT with DTRs 2+ bilaterally Membranes:intact  Fetal Monitoring:  Baseline: 150 bpm CAt I  Labs:  No results found for this or any previous visit (from the past 24 hour(s)).  Imaging Studies:     Currently EPIC will not allow sonographic studies to automatically populate into notes.  In the meantime, copy and paste results into note or free text.  Medications:  Scheduled . amoxicillin  500 mg Oral 3 times per day  . docusate sodium  100 mg Oral Daily  . prenatal multivitamin  1 tablet Oral Q1200   I have reviewed the patient's current medications.  ASSESSMENT: Patient Active Problem List   Diagnosis  Date Noted  . IUGR, antenatal 08/24/2013  . Mild cerebral ventriculomegaly, antepartum 07/22/2013  . Maternal marijuana use complicating pregnancy, antepartum 07/21/2013  . Rubella non-immune status, antepartum 07/16/2013  . 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 Severe dysplasia of cervix (CIN III) on colposcopy biopsy in 2009 07/16/2013  . Supervision of normal pregnancy 01/18/2011   Resolved Ventriculomegaly PLAN: Consider outpatient management , will discuss with MFM today , BPP scheduled for the amif pt still here.  Lindzee Gouge V 08/28/2013,11:13 AM

## 2013-08-28 NOTE — Progress Notes (Signed)
Pt states feels a little dizzy. Instructed to call when needs to go to bathroom. Nurse will assist. Receptive.

## 2013-08-29 ENCOUNTER — Encounter (HOSPITAL_COMMUNITY): Payer: Self-pay | Admitting: Anesthesiology

## 2013-08-29 ENCOUNTER — Inpatient Hospital Stay (HOSPITAL_COMMUNITY): Payer: Medicaid Other

## 2013-08-29 ENCOUNTER — Encounter: Payer: Self-pay | Admitting: *Deleted

## 2013-08-29 ENCOUNTER — Encounter: Payer: Self-pay | Admitting: Family Medicine

## 2013-08-29 LAB — CBC
HCT: 24.6 % — ABNORMAL LOW (ref 36.0–46.0)
Hemoglobin: 8.2 g/dL — ABNORMAL LOW (ref 12.0–15.0)
MCH: 28.6 pg (ref 26.0–34.0)
MCHC: 33.3 g/dL (ref 30.0–36.0)
MCV: 85.7 fL (ref 78.0–100.0)
PLATELETS: 145 10*3/uL — AB (ref 150–400)
RBC: 2.87 MIL/uL — AB (ref 3.87–5.11)
RDW: 13.9 % (ref 11.5–15.5)
WBC: 8.2 10*3/uL (ref 4.0–10.5)

## 2013-08-29 LAB — GROUP B STREP BY PCR: Group B strep by PCR: NEGATIVE

## 2013-08-29 MED ORDER — TERBUTALINE SULFATE 1 MG/ML IJ SOLN: 0.2500 mg | Freq: Once | INTRAMUSCULAR | Status: AC | PRN

## 2013-08-29 MED ORDER — MISOPROSTOL 25 MCG QUARTER TABLET
50.0000 ug | ORAL_TABLET | Freq: Once | ORAL | Status: AC
  Administered 2013-08-29: 50 ug via ORAL
  Filled 2013-08-29: qty 0.5

## 2013-08-29 MED ORDER — TERBUTALINE SULFATE 1 MG/ML IJ SOLN
INTRAMUSCULAR | Status: AC
  Administered 2013-08-29: 0.25 mg via SUBCUTANEOUS
  Filled 2013-08-29: qty 1

## 2013-08-29 MED ORDER — MISOPROSTOL 25 MCG QUARTER TABLET: 25.0000 ug | ORAL_TABLET | ORAL | Status: DC

## 2013-08-29 MED ORDER — TERBUTALINE SULFATE 1 MG/ML IJ SOLN
0.2500 mg | Freq: Once | INTRAMUSCULAR | Status: AC
  Administered 2013-08-29: 0.25 mg via SUBCUTANEOUS

## 2013-08-29 MED ORDER — LACTATED RINGERS IV SOLN
INTRAVENOUS | Status: DC
  Administered 2013-08-29 – 2013-08-31 (×5): via INTRAVENOUS

## 2013-08-29 MED ORDER — CITRIC ACID-SODIUM CITRATE 334-500 MG/5ML PO SOLN
ORAL | Status: AC
  Administered 2013-08-29: 30 mL
  Filled 2013-08-29: qty 15

## 2013-08-29 NOTE — Progress Notes (Signed)
Patient ID: [name] [name], female   DOB: 1986-01-13, 28 y.o.   MRN: [id number]   External Cephalic Version  Preprocedure Diagnosis:  28 y.o. G3P2002 at 8234 weeks gestational age with frank breech presentation  Post-procedure Diagnosis: 28 y.o. G3P2002 at 6534 weeks gestational age with frank breech presentation with succesful version to cephalic  Procedure:  External cephalic version  Procedure in detail:   The patient was brought to Labor and Delivery where a reactive fetal heart tracing was obtained. The patient was noted to have irregular contractions. She was given 1 dose of subcutaneous terbutaline which resolved her contraction. A bedside ultrasound was performed which revealed single intrauterine pregnancy and frank breech presentation. There was noted to be adequate fluid. Using manual pressure, the breech was manipulated in a forward roll fashion until a vertex presentation was obtained. Fetal heart tones were checked intermittently during the procedure and were noted to be reassuring. Following successful external cephalic version, the patient was placed on continuous external fetal monitoring. She was noted to have a reassuring and reactive tracing post procedure.  Will proceed with in duction in 30 mins to one hour.

## 2013-08-29 NOTE — Progress Notes (Signed)
Patient ID: [name] [name], female   DOB: 1985/08/28, 28 y.o.   MRN: [id number]   Reviewed FHT with Dr. Katherina Rightenny, in particular the strip after the BPP.  Pt had 2 long spontaneous decelerations.  We both agree that delivery would be best at this time.  Pt is breech now.  Discussed primary c/s and version with patient.  Pt would like to try version.  Pt understands the risks of version include bu not limited to placental abruption and fetal bradycardia.  Pt also consented for emergency c/s in case of fetal distress during the version.  The risks of cesarean section discussed with the patient included but were not limited to: bleeding which may require transfusion or reoperation; infection which may require antibiotics; injury to bowel, bladder, ureters or other surrounding organs; injury to the fetus; need for additional procedures including hysterectomy in the event of a life-threatening hemorrhage; placental abnormalities wth subsequent pregnancies, incisional problems, thromboembolic phenomenon and other postoperative/anesthesia complications. The patient concurred with the proposed plan, giving informed written consent for the procedure.  Pt is also for BTL and papers have been signed.  Patient desires permanent sterilization.  Other reversible forms of contraception were discussed with patient; she declines all other modalities. Risks of procedure discussed with patient including but not limited to: risk of regret, permanence of method, bleeding, infection, injury to surrounding organs and need for additional procedures.  Failure risk of 0.5-1% with increased risk of ectopic gestation if pregnancy occurs was also discussed with patient.

## 2013-08-29 NOTE — Consult Note (Signed)
MFM Consultation, Staff Note:  In review of the fetal heart tracing, this fetus continues to have spontaneous decelerations that are prolonged and concerning for fetal well-being.  A steroid course has been completed.  At this point, I recommend delivery for this fetus due to risk/concern for impending IUFD.  I spoke personally with Dr. Penne LashLeggett regarding these recommendations.  Given that the fetus is breech we discussed that offering external cephalic version is reasonable given patient counseling of risks of fetal intolerance and need for cesarean versus success with ability to offer trial of labor/induction provided reassuring fetal heart tracing during the attempt.    Time Spent:  I spent in excess of 15 minutes in consultation with this patient to review records, evaluate her case, and provide her with an adequate discussion and education.  More than 50% of this time was spent in counseling. It was a pleasure seeing your patient in the office today.  Thank you for consultation. Please do not hesitate to contact our service for any further questions.   Thank you,  Louann SjogrenJeffrey Morgan Gaynelle Arabianenney   Avier Jech, Louann SjogrenJeffrey Morgan, MD, MS, FACOG Assistant Professor Section of Maternal-Fetal Medicine [facility]

## 2013-08-29 NOTE — Progress Notes (Signed)
Pt transferred to room 170 for version. Report given to [facility] CenterMeredith,RN

## 2013-08-29 NOTE — Progress Notes (Signed)
[name] [name] is a 28 y.o. G3P2002 at 1852w5d by ultrasound @ 29 wks admitted for abnormal fetal growth and elevated cord dopplers for serial BPPs; Now IOL due to spontaneous decels after discussion with MFM.   Subjective: Doing well; No complaints.   Objective: BP 123/58  Pulse 82  Temp(Src) 98.4 F (36.9 C) (Oral)  Resp 16  Ht 5\' 9"  (1.753 m)  Wt 61.009 kg (134 lb 8 oz)  BMI 19.85 kg/m2     FHT:  FHR: 150 bpm, variability: moderate,  accelerations:  Present,  decelerations:  Absent UC:   irregular SVE:   Dilation: Fingertip Effacement (%): 20;30 Station: -3 Exam by:: Dr. Gayla DossJoyner  Labs: Lab Results  Component Value Date   WBC 8.2 08/29/2013   HGB 8.2* 08/29/2013   HCT 24.6* 08/29/2013   MCV 85.7 08/29/2013   PLT 145* 08/29/2013   Assessment / Plan: IOL for  abnormal fetal growth, abnormal cord dopplers, and spontaneous decels.   Labor: Starting Cytotec due to low Bishop score Preeclampsia:  na Fetal Wellbeing:  Category I Pain Control:  Labor support without medications I/D:  GBS pending; On Amox for Enterococcus UTI (Started 08/26/13) Anticipated MOD:  NSVD  Wenda LowJoyner, James 08/29/2013, 4:12 PM  I have seen and examined this patient and agree with above documentation in the resident's note. Pt just had a version by Dr. Penne LashLeggett that was succesful. Vertex presentation confirmed on US prior to cytotec being given.    Rulon AbideKeli Faythe Heitzenrater, M.D. [facility] 08/29/2013 5:31 PM

## 2013-08-29 NOTE — Progress Notes (Signed)
[name] [name] is a 28 y.o. G3P2002 at 7089w5d  admitted for induction of labor due to lagging growth and nonreassuring fetal status.   Subjective: Doing well. Frustrated that she is not having a baby in her arms tonight. Wants this to go faster. +FM.   Objective: BP 112/59  Pulse 89  Temp(Src) 98.4 F (36.9 C) (Oral)  Resp 16  Ht 5\' 9"  (1.753 m)  Wt 61.009 kg (134 lb 8 oz)  BMI 19.85 kg/m2      FHT:  FHR: 145 bpm, variability: moderate,  accelerations:  Present,  decelerations:  Absent UC:   Occasional   SVE:   Dilation: Fingertip Effacement (%): 20;30 Station: -3 Exam by:: Dr. Reola CalkinsBeck  Labs: Lab Results  Component Value Date   WBC 8.2 08/29/2013   HGB 8.2* 08/29/2013   HCT 24.6* 08/29/2013   MCV 85.7 08/29/2013   PLT 145* 08/29/2013    Assessment / Plan: iol for lagging growth and nonreassuring fetal status.   Labor: cont cytotec. cervix still long and thick and firm Fetal Wellbeing:  Category I Pain Control:  Labor support without medications I/D:  n/a Anticipated MOD:  NSVD  Happy Ky L 08/29/2013, 10:56 PM

## 2013-08-30 MED ORDER — MISOPROSTOL 25 MCG QUARTER TABLET
25.0000 ug | ORAL_TABLET | ORAL | Status: DC
  Administered 2013-08-30: 25 ug via VAGINAL
  Filled 2013-08-30 (×4): qty 1
  Filled 2013-08-30: qty 0.25

## 2013-08-30 MED ORDER — MISOPROSTOL 25 MCG QUARTER TABLET
50.0000 ug | ORAL_TABLET | ORAL | Status: DC
  Administered 2013-08-30 (×4): 50 ug via ORAL
  Filled 2013-08-30 (×4): qty 0.5

## 2013-08-30 NOTE — Progress Notes (Signed)
Patient ID: [name] [name], female   DOB: [birth year], 28 y.o.   MRN: [id number] [name] [name] is a 28 y.o. G3P2002 at 8642w6d admitted for IOLindicated by fetal growth lag, abnormal UA dopplers, decels  Subjective: Comfortable. Aware of mild UCs. Good FM. S/P serial cytotetcs  Objective: BP 112/59  Pulse 87  Temp(Src) 98 F (36.7 C) (Oral)  Resp 16  Ht 5\' 9"  (1.753 m)  Wt 61.009 kg (134 lb 8 oz)  BMI 19.85 kg/m2  Fetal Heart FHR: 150 bpm, variability: moderate,  accelerations:  Present,  decelerations:  Absent   Contractions: q 2-5, mild  SVE:   Dilation: Fingertip Effacement (%): 30 Station: Ballotable Exam by:: D Tirsa Gail CNM  Assessment / Plan:  Labor: n/a. Cx unfavorable> cytotec Fetal Wellbeing: Category 1 presently Pain Control:  n/a Expected mode of delivery: NSVD  [name] [name] 08/30/2013, 10:22 AM

## 2013-08-30 NOTE — Progress Notes (Signed)
Patient ID: Hinton Raolysia B Muhlenkamp, female   DOB: November 10, 1985, 28 y.o.   MRN: [id number] Hinton Raolysia B Valente is a 28 y.o. G3P2002 at 1063w6d admitted for IOL for NR fetal HR, growth lag  Subjective: Comfortable.  Objective: BP 125/65  Pulse 88  Temp(Src) 97.9 F (36.6 C) (Oral)  Resp 16  Ht 5\' 9"  (1.753 m)  Wt 61.009 kg (134 lb 8 oz)  BMI 19.85 kg/m2  Fetal Heart FHR: 140 bpm, variability: moderate,  accelerations:  Present,  decelerations:  Absent   Contractions: irreg, mild  SVE:   Dilation: 1 Effacement (%): 30 Station: Ballotable Exam by:: S Earl RN Tight 1, membranes stripped. Attempted introduction of FB but not successful Assessment / Plan:  Labor: n/a Cx unfavorable> may eat and will change to vaginal cytotec Fetal Wellbeing: Category 1 Pain Control:  n/a Expected mode of delivery: NSVD  Geovana Gebel 08/30/2013, 6:42 PM

## 2013-08-30 NOTE — Progress Notes (Signed)
[name] [name] is a 28 y.o. G3P2002 at 5636w6d admitted for induction of labor due to IUGR.  Subjective: Doing well; no complaints.   Objective: BP 120/60  Pulse 70  Temp(Src) 98 F (36.7 C) (Oral)  Resp 16  Ht 5\' 9"  (1.753 m)  Wt 61.009 kg (134 lb 8 oz)  BMI 19.85 kg/m2     FHT: Off monitors for past 30 mins while in restroom; prior FHR: 140 bpm, variability: moderate,  accelerations:  Abscent,  decelerations:  Absent UC:   Irregular every 2-175min  SVE:   Dilation: Fingertip Effacement (%): 30 Station: -3 Exam by:: Dr. Reola CalkinsBeck  Labs: Lab Results  Component Value Date   WBC 8.2 08/29/2013   HGB 8.2* 08/29/2013   HCT 24.6* 08/29/2013   MCV 85.7 08/29/2013   PLT 145* 08/29/2013    Assessment / Plan: IOL due to IUGR and spontaneous decels  Labor: cont cytotec for cervical ripening Fetal Wellbeing:  Category I Pain Control:  Labor support without medications I/D:  n/a Anticipated MOD:  NSVD  Wenda LowJoyner, Mouhamed Glassco 08/30/2013, 10:04 AM

## 2013-08-30 NOTE — Progress Notes (Signed)
Patient ID: [name] [name], female   DOB: [birth year], 28 y.o.   MRN: [id number] [name] [name] is a 28 y.o. G3P2002 at 3479w6d admitted for IOLindicated by fetal growth lag, abnormal UA dopplers, decels  Subjective: Comfortable. Aware of mild UCs. Good FM. S/P serial cytotetcs  Objective: BP 110/52  Pulse 70  Temp(Src) 98 F (36.7 C) (Oral)  Resp 16  Ht 5\' 9"  (1.753 m)  Wt 61.009 kg (134 lb 8 oz)  BMI 19.85 kg/m2  Fetal Heart FHR: 140 bpm, variability: moderate,  accelerations:  Present,  decelerations:  Absent  Contractions: q 2-5, mild  SVE:   Dilation: 1 Effacement (%): 30 Station: Ballotable Exam by:: Ferne CoeS Earl RN  Assessment / Plan:  Labor: n/a. Cx unfavorable> continuing cytotec - will place FB when able Fetal Wellbeing: Category 1 presently Pain Control:  n/a Expected mode of delivery: NSVD  Wenda LowJoyner, Kaina Orengo 08/30/2013, 2:35 PM

## 2013-08-30 NOTE — Progress Notes (Signed)
[name] [name] is a 28 y.o. G3P2002 at 3415w6d admitted for induction of labor due to IUGR.  Subjective:  has been trying to sleep. +FM. No concerns.    Objective: BP 98/47  Pulse 87  Temp(Src) 98 F (36.7 C) (Oral)  Resp 18  Ht 5\' 9"  (1.753 m)  Wt 61.009 kg (134 lb 8 oz)  BMI 19.85 kg/m2      FHT:  FHR: 140d bpm, variability: moderate,  accelerations:  Present,  decelerations:  Present variables UC:   Irregular every 2-505min  SVE:   Dilation: Fingertip Effacement (%): 30 Station: -3 Exam by:: Dr. Reola CalkinsBeck  Labs: Lab Results  Component Value Date   WBC 8.2 08/29/2013   HGB 8.2* 08/29/2013   HCT 24.6* 08/29/2013   MCV 85.7 08/29/2013   PLT 145* 08/29/2013    Assessment / Plan: IOL due to IUGR and spontaneous decels  Labor: cont cytotec for cervical ripening Fetal Wellbeing:  Category I Pain Control:  Labor support without medications I/D:  n/a Anticipated MOD:  NSVD  [name] [name] 08/30/2013, 4:36 AM

## 2013-08-30 NOTE — Progress Notes (Signed)
Attempt to place a Foley bulb in cervix per D Poe CNM unsuccessful.  Continue with vaginal cytotec.

## 2013-08-30 NOTE — Progress Notes (Signed)
[name] [name] is a 28 y.o. G3P2002 at 4128w6d.  Subjective: Pressure w/ UC's  Objective: BP 119/65  Pulse 91  Temp(Src) 98.8 F (37.1 C) (Oral)  Resp 18  Ht 5\' 9"  (1.753 m)  Wt 61.009 kg (134 lb 8 oz)  BMI 19.85 kg/m2      FHT:  FHR: 150 bpm, variability: moderate,  accelerations:  Present,  decelerations:  Absent UC:   irregular, every 2-5 minutes, mild SVE:   Dilation: 1 Effacement (%): 30 Station: Ballotable Exam by:: Campbell SoupS Earl RN  Labs: Lab Results  Component Value Date   WBC 8.2 08/29/2013   HGB 8.2* 08/29/2013   HCT 24.6* 08/29/2013   MCV 85.7 08/29/2013   PLT 145* 08/29/2013    Assessment / Plan: Induction of labor due to IUGR,  progressing well on pitocin  Labor: Progressing normally Preeclampsia:  NA Fetal Wellbeing:  Category I Pain Control:  None I/D:  n/a Anticipated MOD:  NSVD  Stony Stegmann 08/30/2013, 9:30 PM

## 2013-08-31 ENCOUNTER — Encounter (HOSPITAL_COMMUNITY): Payer: Self-pay | Admitting: *Deleted

## 2013-08-31 ENCOUNTER — Ambulatory Visit (HOSPITAL_COMMUNITY): Payer: Medicaid Other

## 2013-08-31 DIAGNOSIS — O365990 Maternal care for other known or suspected poor fetal growth, unspecified trimester, not applicable or unspecified: Secondary | ICD-10-CM

## 2013-08-31 DIAGNOSIS — B069 Rubella without complication: Secondary | ICD-10-CM

## 2013-08-31 DIAGNOSIS — O321XX0 Maternal care for breech presentation, not applicable or unspecified: Secondary | ICD-10-CM

## 2013-08-31 DIAGNOSIS — O98519 Other viral diseases complicating pregnancy, unspecified trimester: Secondary | ICD-10-CM

## 2013-08-31 DIAGNOSIS — O9852 Other viral diseases complicating childbirth: Secondary | ICD-10-CM

## 2013-08-31 LAB — CBC
HCT: 29.5 % — ABNORMAL LOW (ref 36.0–46.0)
Hemoglobin: 9.7 g/dL — ABNORMAL LOW (ref 12.0–15.0)
MCH: 28.4 pg (ref 26.0–34.0)
MCHC: 32.9 g/dL (ref 30.0–36.0)
MCV: 86.3 fL (ref 78.0–100.0)
PLATELETS: 143 10*3/uL — AB (ref 150–400)
RBC: 3.42 MIL/uL — ABNORMAL LOW (ref 3.87–5.11)
RDW: 13.9 % (ref 11.5–15.5)
WBC: 10.1 10*3/uL (ref 4.0–10.5)

## 2013-08-31 MED ORDER — ZOLPIDEM TARTRATE 5 MG PO TABS: 5.0000 mg | ORAL_TABLET | Freq: Every evening | ORAL | Status: DC | PRN

## 2013-08-31 MED ORDER — LACTATED RINGERS IV SOLN: 500.0000 mL | INTRAVENOUS | Status: DC | PRN

## 2013-08-31 MED ORDER — OXYTOCIN BOLUS FROM INFUSION: 500.0000 mL | INTRAVENOUS | Status: DC

## 2013-08-31 MED ORDER — ACETAMINOPHEN 325 MG PO TABS: 650.0000 mg | ORAL_TABLET | ORAL | Status: DC | PRN

## 2013-08-31 MED ORDER — WITCH HAZEL-GLYCERIN EX PADS
1.0000 "application " | MEDICATED_PAD | CUTANEOUS | Status: DC | PRN
  Administered 2013-08-31: 1 via TOPICAL

## 2013-08-31 MED ORDER — OXYCODONE-ACETAMINOPHEN 5-325 MG PO TABS: 1.0000 | ORAL_TABLET | ORAL | Status: DC | PRN

## 2013-08-31 MED ORDER — IBUPROFEN 600 MG PO TABS: 600.0000 mg | ORAL_TABLET | Freq: Four times a day (QID) | ORAL | Status: DC | PRN

## 2013-08-31 MED ORDER — ONDANSETRON HCL 4 MG/2ML IJ SOLN: 4.0000 mg | INTRAMUSCULAR | Status: DC | PRN

## 2013-08-31 MED ORDER — IBUPROFEN 600 MG PO TABS
600.0000 mg | ORAL_TABLET | Freq: Four times a day (QID) | ORAL | Status: DC
  Administered 2013-08-31 – 2013-09-02 (×5): 600 mg via ORAL
  Filled 2013-08-31 (×6): qty 1

## 2013-08-31 MED ORDER — DIBUCAINE 1 % RE OINT
1.0000 "application " | TOPICAL_OINTMENT | RECTAL | Status: DC | PRN
  Administered 2013-08-31: 1 via RECTAL
  Filled 2013-08-31: qty 28

## 2013-08-31 MED ORDER — MAGNESIUM HYDROXIDE 400 MG/5ML PO SUSP: 30.0000 mL | ORAL | Status: DC | PRN

## 2013-08-31 MED ORDER — FAMOTIDINE 20 MG PO TABS
40.0000 mg | ORAL_TABLET | Freq: Once | ORAL | Status: AC
  Administered 2013-08-31: 40 mg via ORAL
  Filled 2013-08-31: qty 2

## 2013-08-31 MED ORDER — BENZOCAINE-MENTHOL 20-0.5 % EX AERO
1.0000 "application " | INHALATION_SPRAY | CUTANEOUS | Status: DC | PRN
  Administered 2013-08-31: 1 via TOPICAL
  Filled 2013-08-31: qty 56

## 2013-08-31 MED ORDER — LACTATED RINGERS IV SOLN
INTRAVENOUS | Status: DC
  Administered 2013-09-01: 17:00:00 via INTRAVENOUS

## 2013-08-31 MED ORDER — LACTATED RINGERS IV SOLN
INTRAVENOUS | Status: DC
  Administered 2013-08-31: 17:00:00 via INTRAVENOUS

## 2013-08-31 MED ORDER — METOCLOPRAMIDE HCL 10 MG PO TABS
10.0000 mg | ORAL_TABLET | Freq: Once | ORAL | Status: AC
  Administered 2013-08-31: 10 mg via ORAL
  Filled 2013-08-31: qty 1

## 2013-08-31 MED ORDER — OXYTOCIN 40 UNITS IN LACTATED RINGERS INFUSION - SIMPLE MED
INTRAVENOUS | Status: AC
  Filled 2013-08-31: qty 1000

## 2013-08-31 MED ORDER — OXYCODONE-ACETAMINOPHEN 5-325 MG PO TABS
1.0000 | ORAL_TABLET | ORAL | Status: DC | PRN
  Administered 2013-09-01: 2 via ORAL
  Administered 2013-09-01 – 2013-09-02 (×4): 1 via ORAL
  Filled 2013-08-31 (×3): qty 1
  Filled 2013-08-31: qty 2
  Filled 2013-08-31: qty 1

## 2013-08-31 MED ORDER — OXYTOCIN 40 UNITS IN LACTATED RINGERS INFUSION - SIMPLE MED: 1.0000 m[IU]/min | INTRAVENOUS | Status: DC

## 2013-08-31 MED ORDER — OXYTOCIN 40 UNITS IN LACTATED RINGERS INFUSION - SIMPLE MED: 62.5000 mL/h | INTRAVENOUS | Status: DC

## 2013-08-31 MED ORDER — PRENATAL MULTIVITAMIN CH
1.0000 | ORAL_TABLET | Freq: Every day | ORAL | Status: DC
  Administered 2013-09-02: 1 via ORAL
  Filled 2013-08-31: qty 1

## 2013-08-31 MED ORDER — LANOLIN HYDROUS EX OINT: 1.0000 "application " | TOPICAL_OINTMENT | CUTANEOUS | Status: DC | PRN

## 2013-08-31 MED ORDER — LIDOCAINE HCL (PF) 1 % IJ SOLN
30.0000 mL | INTRAMUSCULAR | Status: DC | PRN
  Filled 2013-08-31: qty 30

## 2013-08-31 MED ORDER — METHYLERGONOVINE MALEATE 0.2 MG PO TABS: 0.2000 mg | ORAL_TABLET | ORAL | Status: DC | PRN

## 2013-08-31 MED ORDER — FERROUS SULFATE 325 (65 FE) MG PO TABS
325.0000 mg | ORAL_TABLET | Freq: Two times a day (BID) | ORAL | Status: DC
  Administered 2013-09-02: 325 mg via ORAL
  Filled 2013-08-31: qty 1

## 2013-08-31 MED ORDER — TERBUTALINE SULFATE 1 MG/ML IJ SOLN: 0.2500 mg | Freq: Once | INTRAMUSCULAR | Status: DC | PRN

## 2013-08-31 MED ORDER — METHYLERGONOVINE MALEATE 0.2 MG/ML IJ SOLN: 0.2000 mg | INTRAMUSCULAR | Status: DC | PRN

## 2013-08-31 MED ORDER — SENNOSIDES-DOCUSATE SODIUM 8.6-50 MG PO TABS
2.0000 | ORAL_TABLET | ORAL | Status: DC
  Administered 2013-08-31 – 2013-09-02 (×2): 2 via ORAL
  Filled 2013-08-31 (×2): qty 2

## 2013-08-31 MED ORDER — MEASLES, MUMPS & RUBELLA VAC ~~LOC~~ INJ
0.5000 mL | INJECTION | Freq: Once | SUBCUTANEOUS | Status: AC
  Administered 2013-09-02: 0.5 mL via SUBCUTANEOUS
  Filled 2013-08-31 (×2): qty 0.5

## 2013-08-31 MED ORDER — DIPHENHYDRAMINE HCL 25 MG PO CAPS: 25.0000 mg | ORAL_CAPSULE | Freq: Four times a day (QID) | ORAL | Status: DC | PRN

## 2013-08-31 MED ORDER — OXYTOCIN 40 UNITS IN LACTATED RINGERS INFUSION - SIMPLE MED
1.0000 m[IU]/min | INTRAVENOUS | Status: DC
Start: 2013-08-31 — End: 2013-08-31
  Administered 2013-08-31: 2 m[IU]/min via INTRAVENOUS

## 2013-08-31 MED ORDER — ONDANSETRON HCL 4 MG/2ML IJ SOLN
4.0000 mg | Freq: Four times a day (QID) | INTRAMUSCULAR | Status: DC | PRN
  Administered 2013-08-31: 4 mg via INTRAVENOUS
  Filled 2013-08-31: qty 2

## 2013-08-31 MED ORDER — SIMETHICONE 80 MG PO CHEW: 80.0000 mg | CHEWABLE_TABLET | ORAL | Status: DC | PRN

## 2013-08-31 MED ORDER — CITRIC ACID-SODIUM CITRATE 334-500 MG/5ML PO SOLN: 30.0000 mL | ORAL | Status: DC | PRN

## 2013-08-31 MED ORDER — ONDANSETRON HCL 4 MG PO TABS: 4.0000 mg | ORAL_TABLET | ORAL | Status: DC | PRN

## 2013-08-31 MED ORDER — FENTANYL CITRATE 0.05 MG/ML IJ SOLN
100.0000 ug | INTRAMUSCULAR | Status: DC | PRN
  Administered 2013-08-31 (×6): 100 ug via INTRAVENOUS
  Filled 2013-08-31 (×6): qty 2

## 2013-08-31 NOTE — Progress Notes (Signed)
[name] [name] is a 28 y.o. G3P2002 at 6478w0d.  Subjective: Increased discomfort w/ UC's.   Objective: BP 119/62  Pulse 85  Temp(Src) 97.8 F (36.6 C) (Oral)  Resp 18  Ht 5\' 9"  (1.753 m)  Wt 61.009 kg (134 lb 8 oz)  BMI 19.85 kg/m2      FHT:  FHR: 130 bpm, variability: moderate,  accelerations:  Present,  decelerations:  Absent UC:   irregular, every 2-5 minutes, mild-moderate SVE:   Dilation: 5 Effacement (%): 60 Station: Ballotable Exam by:: V Alliah Boulanger CNM Foley bulb out  Difficulty determining presenting part due to BBOW. Vtx verified by BS US.   Labs: Lab Results  Component Value Date   WBC 8.2 08/29/2013   HGB 8.2* 08/29/2013   HCT 24.6* 08/29/2013   MCV 85.7 08/29/2013   PLT 145* 08/29/2013    Assessment / Plan: Induction of labor due to IUGR,  progressing well on pitocin  Labor: Progressing w/ Foley bulb, will increase pitocin then AROM Preeclampsia:  NA Fetal Wellbeing:  Category I Pain Control:  Fentanyl I/D:  n/a Anticipated MOD:  NSVD  Gavan Nordby 08/31/2013, 6:53 AM

## 2013-08-31 NOTE — Progress Notes (Signed)
[name] [name] is a 28 y.o. G3P2002 at 578w0d admitted for induction of labor due to IUGR and prolonged decels.  Subjective: Pt comfortable with no complaints. Rec'd Fent s/p FB. On pit 18mU  Objective: BP 119/64  Pulse 82  Temp(Src) 98.4 F (36.9 C) (Oral)  Resp 18  Ht 5\' 9"  (1.753 m)  Wt 134 lb 8 oz (61.009 kg)  BMI 19.85 kg/m2      FHT:  FHR: 140s bpm, variability: moderate,  accelerations:  Abscent,  decelerations:  Present recurrent lates 1145-12. resolved with usual intervention UC:    q2-673min SVE:   Dilation: 5 Effacement (%): 40 Station: Ballotable Exam by:: Debroah LoopArnold, MD  Labs: Lab Results  Component Value Date   WBC 8.2 08/29/2013   HGB 8.2* 08/29/2013   HCT 24.6* 08/29/2013   MCV 85.7 08/29/2013   PLT 145* 08/29/2013    Assessment / Plan: Induction of labor due to IUGR,  No change on pitocin  Labor: continue pitocin and AROM when able Preeclampsia:  no signs or symptoms of toxicity Fetal Wellbeing:  Category II Pain Control:  Epidural and Fentanyl PRN I/D:  n/a Anticipated MOD:  NSVD  Ariatna Jester RYAN 08/31/2013, 12:49 PM

## 2013-08-31 NOTE — Progress Notes (Signed)
08/31/13 1500  Clinical Encounter Type  Visited With Patient and family together (fiance and 30yo son Max)  Visit Type Follow-up  Spiritual Encounters  Spiritual Needs Emotional  Stress Factors  Patient Stress Factors Family relationships;Loss of control;Financial concerns;Lack of caregivers   Checked in with [name] this afternoon to offer support as she awaits progress in labor.  With pt's permission, brought a small coloring book, crayons, and a toy car for son Max.  Provided pastoral presence and empathic listening, particularly while fiance was out of the room.  Pt stated desire to have his support and disappointment at his distraction (per pt: due to his pain issues, financial and pawn concerns, and his disqualification from completing detox program at [facility] due to missed sessions) from "how I feel and how the baby's doing.  This is the most important thing in the world."  Spiritual Care is following for support, but please page as needs arise or pt's situation changes:  [phone].  Thank you.  [address]Chaplain Aiven Kampe MunisingLunde[place], [place] [phone]

## 2013-08-31 NOTE — Progress Notes (Signed)
[name] [name] is a 28 y.o. G3P2002 at 4874w6d.   Subjective: Sleeping. Ctx still mild.   Objective: BP 113/51  Pulse 71  Temp(Src) 97.8 F (36.6 C) (Oral)  Resp 18  Ht 5\' 9"  (1.753 m)  Wt 61.009 kg (134 lb 8 oz)  BMI 19.85 kg/m2      FHT:  FHR: 150 bpm, variability: moderate,  accelerations:  Present,  decelerations:  Absent UC:   irregular, every 2-5 minutes, mild SVE:   Dilation: 1.5 Effacement (%): 50 Station: Ballotable Exam by:: [name] [name], CNM  Labs: Lab Results  Component Value Date   WBC 8.2 08/29/2013   HGB 8.2* 08/29/2013   HCT 24.6* 08/29/2013   MCV 85.7 08/29/2013   PLT 145* 08/29/2013    Assessment / Plan: Induction of labor due to IUGR,  progressing well on pitocin  Labor: Progressing normally; FB placed @ 12:30; continue low dose pit, increased ctx frequency Preeclampsia:  NA Fetal Wellbeing:  Category I Pain Control:  Fentanyl I/D:  GBS neg Anticipated MOD:  NSVD  Michaelene SongHall, Devanny Palecek C 08/31/2013, 5:51 AM

## 2013-08-31 NOTE — Progress Notes (Signed)
[name] [name] is a 28 y.o. G3P2002 at 3330w6d.   Subjective: Tired, wanted to get some sleep. No increase in pressure with ctx, still mild.   Objective: BP 122/58  Pulse 92  Temp(Src) 98.8 F (37.1 C) (Oral)  Resp 20  Ht 5\' 9"  (1.753 m)  Wt 61.009 kg (134 lb 8 oz)  BMI 19.85 kg/m2      FHT:  FHR: 150 bpm, variability: moderate,  accelerations:  Present,  decelerations:  Absent UC:   irregular, every 2-5 minutes, mild SVE:   Dilation: 1 Effacement (%): 30 Station: Ballotable Exam by:: Campbell [employer]S Earl RN  Labs: Lab Results  Component Value Date   WBC 8.2 08/29/2013   HGB 8.2* 08/29/2013   HCT 24.6* 08/29/2013   MCV 85.7 08/29/2013   PLT 145* 08/29/2013    Assessment / Plan: Induction of labor due to IUGR,  progressing well on pitocin  Labor: Progressing normally; FB placed @ 12:30; continuing on low dose pit Preeclampsia:  NA Fetal Wellbeing:  Category I Pain Control:  None I/D:  GBS neg Anticipated MOD:  NSVD  Michaelene SongHall, Wyland Rastetter C 08/31/2013, 12:43 AM

## 2013-08-31 NOTE — Progress Notes (Signed)
[name] [name] is a 28 y.o. G3P2002 at [redacted]w[redacted]d admitted for induction of labor due to IUGR and prolonged decels.  Subjective: Pt comfortable with no complaints. Rec'd Fent multiple times.  s/p FB. On pit 18mU s/p AROM  Objective: BP 122/60  Pulse 86  Temp(Src) 97.6 F (36.4 C) (Oral)  Resp 18  Ht 5\' 9"  (1.753 m)  Wt 61.009 kg (134 lb 8 oz)  BMI 19.85 kg/m2      FHT:  FHR: 140s bpm, variability: moderate,  accelerations:  Abscent,  decelerations:  Present recurrent lates 1145-12. resolved with usual intervention UC:    q2-183min SVE:   Dilation: 5 Effacement (%): 40 Station: Ballotable Exam by:: Debroah LoopArnold, MD  Labs: Lab Results  Component Value Date   WBC 10.1 08/31/2013   HGB 9.7* 08/31/2013   HCT 29.5* 08/31/2013   MCV 86.3 08/31/2013   PLT 143* 08/31/2013    Assessment / Plan: Induction of labor due to IUGR,  S/p AROM on pit. Clear fluid  Labor: continue pitocin and s/p AROM Preeclampsia:  no signs or symptoms of toxicity Fetal Wellbeing:  Cat I Pain Control:  Epidural and Fentanyl PRN I/D:  n/a Anticipated MOD:  NSVD  Shearon Clonch RYAN 08/31/2013, 4:49 PM

## 2013-08-31 NOTE — Progress Notes (Signed)
08/31/13 0900  Clinical Encounter Type  Visited With Patient  Visit Type Initial  Referral From Chaplain   Late entry.  Visited yesterday (08/30/13, ca noon) to follow up with [name]  She was eager to have her labor and delivery experience underway, and was more quiet and emotionally reserved on this follow-up visit. She shared that her fiance stayed overnight after she told him that the baby would be coming soon; I asked about how it was for her to have him there, and she said, "Fine.  Our relationship is good.  It's his stuff that's hard."  [name] [name] is aware of ongoing chaplain availability, and we plan for me to visit later today, depending on her labor process and comfort with having a visitor.  Please also page as needs arise:  [phone].  Thank you.  [address]Chaplain Heinrich Fertig [place], [place] DakotaMDiv [phone]

## 2013-09-01 ENCOUNTER — Encounter (HOSPITAL_COMMUNITY)
Admission: AD | Disposition: A | Discharge status: Home / Self Care | Payer: Self-pay | Source: Ambulatory Visit | Attending: Obstetrics & Gynecology

## 2013-09-01 ENCOUNTER — Encounter (HOSPITAL_COMMUNITY): Payer: Medicaid Other | Admitting: Anesthesiology

## 2013-09-01 ENCOUNTER — Inpatient Hospital Stay (HOSPITAL_COMMUNITY): Payer: Medicaid Other | Admitting: Anesthesiology

## 2013-09-01 ENCOUNTER — Encounter (HOSPITAL_COMMUNITY): Payer: Self-pay

## 2013-09-01 DIAGNOSIS — Z302 Encounter for sterilization: Secondary | ICD-10-CM

## 2013-09-01 HISTORY — PX: TUBAL LIGATION: SHX77

## 2013-09-01 LAB — CBC
HEMATOCRIT: 23.6 % — AB (ref 36.0–46.0)
HEMOGLOBIN: 8.3 g/dL — AB (ref 12.0–15.0)
MCH: 30.1 pg (ref 26.0–34.0)
MCHC: 35.2 g/dL (ref 30.0–36.0)
MCV: 85.5 fL (ref 78.0–100.0)
Platelets: 138 10*3/uL — ABNORMAL LOW (ref 150–400)
RBC: 2.76 MIL/uL — AB (ref 3.87–5.11)
RDW: 13.8 % (ref 11.5–15.5)
WBC: 11.1 10*3/uL — AB (ref 4.0–10.5)

## 2013-09-01 LAB — MRSA PCR SCREENING: MRSA BY PCR: NEGATIVE

## 2013-09-01 LAB — GLUCOSE, CAPILLARY
Glucose-Capillary: 43 mg/dL — CL (ref 70–99)
Glucose-Capillary: 53 mg/dL — ABNORMAL LOW (ref 70–99)

## 2013-09-01 LAB — RPR: RPR Ser Ql: NONREACTIVE

## 2013-09-01 SURGERY — LIGATION, FALLOPIAN TUBE, POSTPARTUM
Anesthesia: Spinal | Site: Abdomen

## 2013-09-01 MED ORDER — SODIUM BICARBONATE 8.4 % IV SOLN
INTRAVENOUS | Status: AC
  Filled 2013-09-01: qty 50

## 2013-09-01 MED ORDER — MIDAZOLAM HCL 2 MG/2ML IJ SOLN
INTRAMUSCULAR | Status: AC
  Filled 2013-09-01: qty 2

## 2013-09-01 MED ORDER — HYDROMORPHONE HCL PF 1 MG/ML IJ SOLN
INTRAMUSCULAR | Status: AC
  Administered 2013-09-01: 0.5 mg via INTRAVENOUS
  Filled 2013-09-01: qty 1

## 2013-09-01 MED ORDER — LIDOCAINE-EPINEPHRINE (PF) 2 %-1:200000 IJ SOLN
INTRAMUSCULAR | Status: AC
  Filled 2013-09-01: qty 20

## 2013-09-01 MED ORDER — KETOROLAC TROMETHAMINE 30 MG/ML IJ SOLN
15.0000 mg | Freq: Once | INTRAMUSCULAR | Status: AC | PRN
  Administered 2013-09-01: 30 mg via INTRAVENOUS

## 2013-09-01 MED ORDER — MEPERIDINE HCL 25 MG/ML IJ SOLN: 6.2500 mg | INTRAMUSCULAR | Status: DC | PRN

## 2013-09-01 MED ORDER — KETOROLAC TROMETHAMINE 30 MG/ML IJ SOLN
INTRAMUSCULAR | Status: AC
  Filled 2013-09-01: qty 1

## 2013-09-01 MED ORDER — PROPOFOL 10 MG/ML IV EMUL
INTRAVENOUS | Status: AC
  Filled 2013-09-01: qty 20

## 2013-09-01 MED ORDER — 0.9 % SODIUM CHLORIDE (POUR BTL) OPTIME
TOPICAL | Status: DC | PRN
  Administered 2013-09-01: 1000 mL

## 2013-09-01 MED ORDER — BUPIVACAINE IN DEXTROSE 0.75-8.25 % IT SOLN
INTRATHECAL | Status: DC | PRN
  Administered 2013-09-01: 1.4 mL via INTRATHECAL

## 2013-09-01 MED ORDER — LIDOCAINE HCL (CARDIAC) 20 MG/ML IV SOLN
INTRAVENOUS | Status: AC
  Filled 2013-09-01: qty 5

## 2013-09-01 MED ORDER — BUPIVACAINE HCL 0.5 % IJ SOLN
INTRAMUSCULAR | Status: DC | PRN
  Administered 2013-09-01: 20 mL

## 2013-09-01 MED ORDER — HYDROMORPHONE HCL PF 1 MG/ML IJ SOLN
INTRAMUSCULAR | Status: AC
  Administered 2013-09-01: 0.25 mg via INTRAVENOUS
  Filled 2013-09-01: qty 1

## 2013-09-01 MED ORDER — FENTANYL CITRATE 0.05 MG/ML IJ SOLN
INTRAMUSCULAR | Status: AC
  Filled 2013-09-01: qty 2

## 2013-09-01 MED ORDER — HYDROMORPHONE HCL PF 1 MG/ML IJ SOLN
0.2500 mg | INTRAMUSCULAR | Status: DC | PRN
  Administered 2013-09-01: 0.5 mg via INTRAVENOUS
  Administered 2013-09-01 (×2): 0.25 mg via INTRAVENOUS
  Administered 2013-09-01: 0.5 mg via INTRAVENOUS

## 2013-09-01 MED ORDER — ONDANSETRON HCL 4 MG/2ML IJ SOLN
INTRAMUSCULAR | Status: AC
  Filled 2013-09-01: qty 2

## 2013-09-01 MED ORDER — DEXTROSE-NACL 5-0.45 % IV SOLN
INTRAVENOUS | Status: DC
  Administered 2013-09-01: 14:00:00 via INTRAVENOUS

## 2013-09-01 MED ORDER — DEXAMETHASONE SODIUM PHOSPHATE 10 MG/ML IJ SOLN
INTRAMUSCULAR | Status: AC
  Filled 2013-09-01: qty 1

## 2013-09-01 MED ORDER — LACTATED RINGERS IV SOLN
INTRAVENOUS | Status: DC | PRN
  Administered 2013-09-01: 16:00:00 via INTRAVENOUS

## 2013-09-01 MED ORDER — FENTANYL CITRATE 0.05 MG/ML IJ SOLN
INTRAMUSCULAR | Status: DC | PRN
  Administered 2013-09-01 (×2): 50 ug via INTRAVENOUS

## 2013-09-01 MED ORDER — PROMETHAZINE HCL 25 MG/ML IJ SOLN: 6.2500 mg | INTRAMUSCULAR | Status: DC | PRN

## 2013-09-01 MED ORDER — MIDAZOLAM HCL 5 MG/5ML IJ SOLN
INTRAMUSCULAR | Status: DC | PRN
  Administered 2013-09-01 (×2): 1 mg via INTRAVENOUS

## 2013-09-01 MED ORDER — PROPOFOL 10 MG/ML IV BOLUS
INTRAVENOUS | Status: DC | PRN
  Administered 2013-09-01 (×3): 30 mg via INTRAVENOUS
  Administered 2013-09-01: 50 mg via INTRAVENOUS
  Administered 2013-09-01: 30 mg via INTRAVENOUS

## 2013-09-01 MED ORDER — PHENYLEPHRINE HCL 10 MG/ML IJ SOLN
INTRAMUSCULAR | Status: DC | PRN
  Administered 2013-09-01 (×2): 40 ug via INTRAVENOUS

## 2013-09-01 MED ORDER — BUPIVACAINE HCL (PF) 0.5 % IJ SOLN
INTRAMUSCULAR | Status: AC
  Filled 2013-09-01: qty 30

## 2013-09-01 MED ORDER — GLYCOPYRROLATE 0.2 MG/ML IJ SOLN
INTRAMUSCULAR | Status: DC | PRN
  Administered 2013-09-01: 0.1 mg via INTRAVENOUS

## 2013-09-01 MED ORDER — EPHEDRINE SULFATE 50 MG/ML IJ SOLN
INTRAMUSCULAR | Status: DC | PRN
  Administered 2013-09-01: 10 mg via INTRAVENOUS

## 2013-09-01 SURGICAL SUPPLY — 21 items
BENZOIN TINCTURE PRP APPL 2/3 (GAUZE/BANDAGES/DRESSINGS) ×3 IMPLANT
CATH ROBINSON RED A/P 16FR (CATHETERS) IMPLANT
CHLORAPREP W/TINT 26ML (MISCELLANEOUS) ×3 IMPLANT
CLIP FILSHIE TUBAL LIGA STRL (Clip) ×3 IMPLANT
CLOSURE WOUND 1/4X4 (GAUZE/BANDAGES/DRESSINGS) ×1
CLOTH BEACON ORANGE TIMEOUT ST (SAFETY) ×3 IMPLANT
DRSG COVADERM PLUS 2X2 (GAUZE/BANDAGES/DRESSINGS) ×3 IMPLANT
GLOVE ECLIPSE 7.0 STRL STRAW (GLOVE) ×3 IMPLANT
GLOVE INDICATOR 7.0 STRL GRN (GLOVE) ×6 IMPLANT
GOWN STRL REUS W/TWL LRG LVL3 (GOWN DISPOSABLE) ×6 IMPLANT
NEEDLE HYPO 25X1 1.5 SAFETY (NEEDLE) ×3 IMPLANT
NS IRRIG 1000ML POUR BTL (IV SOLUTION) ×3 IMPLANT
PACK ABDOMINAL MINOR (CUSTOM PROCEDURE TRAY) ×3 IMPLANT
STRIP CLOSURE SKIN 1/4X4 (GAUZE/BANDAGES/DRESSINGS) ×2 IMPLANT
SUT VIC AB 0 CT1 27 (SUTURE) ×2
SUT VIC AB 0 CT1 27XBRD ANBCTR (SUTURE) ×1 IMPLANT
SUT VIC AB 4-0 PS2 27 (SUTURE) ×3 IMPLANT
SYR CONTROL 10ML LL (SYRINGE) ×3 IMPLANT
TOWEL OR 17X24 6PK STRL BLUE (TOWEL DISPOSABLE) ×6 IMPLANT
TRAY FOLEY BAG SILVER LF 16FR (CATHETERS) ×3 IMPLANT
WATER STERILE IRR 1000ML POUR (IV SOLUTION) IMPLANT
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
## 2013-09-01 NOTE — Progress Notes (Signed)
Post Partum Day 1  Subjective: no complaints, voiding and tolerating PO Pt. Is doing well overnight, without complaints. She is scheduled for BTL today. Baby in NICU.  Objective: Blood pressure 111/57, pulse 71, temperature 98.5 F (36.9 C), temperature source Oral, resp. rate 15, height 5\' 9"  (1.753 m), weight 61.009 kg (134 lb 8 oz), SpO2 100.00%, unknown if currently breastfeeding.  Physical Exam:  General: alert, cooperative and no distress Lochia: appropriate Uterine Fundus: firm DVT Evaluation: No evidence of DVT seen on physical exam. Negative Homan's sign. No cords or calf tenderness. No significant calf/ankle edema. CV: RRR Pulm: CTA bilat   Recent Labs  08/31/13 1320 09/01/13 0535  HGB 9.7* 8.3*  HCT 29.5* 23.6*    Assessment/Plan: BTL scheduled today at 2:30pm   LOS: 8 days   Manya Silvasnderson, Florabelle Cardin M 09/01/2013, 7:54 AM

## 2013-09-01 NOTE — Anesthesia Preprocedure Evaluation (Addendum)
Anesthesia Evaluation  Patient identified by MRN, date of birth, ID band Patient awake    Reviewed: Allergy & Precautions, H&P , NPO status , Patient's Chart, lab work & pertinent test results  Airway Mallampati: I TM Distance: >3 FB Neck ROM: full    Dental no notable dental hx.    Pulmonary neg pulmonary ROS, Current Smoker,    Pulmonary exam normal       Cardiovascular negative cardio ROS      Neuro/Psych negative neurological ROS  negative psych ROS   GI/Hepatic negative GI ROS, Neg liver ROS,   Endo/Other  negative endocrine ROS  Renal/GU negative Renal ROS  negative genitourinary   Musculoskeletal   Abdominal Normal abdominal exam  (+)   Peds  Hematology negative hematology ROS (+)   Anesthesia Other Findings   Reproductive/Obstetrics                         Anesthesia Physical Anesthesia Plan  ASA: II  Anesthesia Plan: Spinal   Post-op Pain Management:    Induction:   Airway Management Planned:   Additional Equipment:   Intra-op Plan:   Post-operative Plan:   Informed Consent: I have reviewed the patients History and Physical, chart, labs and discussed the procedure including the risks, benefits and alternatives for the proposed anesthesia with the patient or authorized representative who has indicated his/her understanding and acceptance.     Plan Discussed with: CRNA and Surgeon  Anesthesia Plan Comments:         Anesthesia Quick Evaluation

## 2013-09-01 NOTE — Anesthesia Procedure Notes (Signed)
Spinal  Patient location during procedure: OR Start time: 09/01/2013 3:46 PM End time: 09/01/2013 3:48 PM Staffing Anesthesiologist: Leilani AbleHATCHETT, Alfie Alderfer Performed by: anesthesiologist  Preanesthetic Checklist Completed: patient identified, surgical consent, pre-op evaluation, timeout performed, IV checked, risks and benefits discussed and monitors and equipment checked Spinal Block Patient position: sitting Prep: DuraPrep Patient monitoring: heart rate, cardiac monitor, continuous pulse ox and blood pressure Approach: midline Location: L3-4 Injection technique: single-shot Needle Needle type: Pencan  Needle gauge: 24 G Needle length: 9 cm Needle insertion depth: 4 cm Assessment Sensory level: T6

## 2013-09-01 NOTE — Progress Notes (Signed)
Report called to Cher NakaiStephanie Vaughn, RN in Short Stay.

## 2013-09-01 NOTE — Progress Notes (Signed)
UR completed 

## 2013-09-01 NOTE — Transfer of Care (Signed)
Immediate Anesthesia Transfer of Care Note  Patient: [name] [name]  Procedure(s) Performed: Procedure(s) with comments: POST PARTUM TUBAL LIGATION (N/A) - with filsie clips  Patient Location: PACU  Anesthesia Type:Spinal  Level of Consciousness: awake, alert  and oriented  Airway & Oxygen Therapy: Patient Spontanous Breathing and Patient connected to nasal cannula oxygen  Post-op Assessment: Report given to PACU RN and Post -op Vital signs reviewed and stable  Post vital signs: Reviewed and stable  Complications: No apparent anesthesia complications

## 2013-09-01 NOTE — Lactation Note (Signed)
This note was copied from the chart of [name] [name]. Lactation Consultation Note   Initial consult with this mom of a [redacted] week gestation baby, in NICU. Mom has breastfed her first two children, and done some pumping, but this is herf irst NICU baby. I reviewed hand expression with mom, and setting of DEP for premie setting. Teaching sone on lactation and from the NICU booklet, on how to provide EBm for a NICU baby. [facility] to see if I need to loan a pump to mom, or if she can get one tomorrow, as her appointment is for 3/26. Mom is having a tubal ligation today, but should be discharged to home by tomorrow.   Patient Name: [name] Sharene Skeanslysia Greeson UEAVW'UToday's Date: 09/01/2013 Reason for consult: Initial assessment;NICU baby;Late preterm infant;Infant < 6lbs   Maternal Data Formula Feeding for Exclusion: Yes (baby in NICU) Infant to breast within first hour of birth: No Breastfeeding delayed due to:: Infant status Has patient been taught Hand Expression?: Yes Does the patient have breastfeeding experience prior to this delivery?: Yes  Feeding Feeding Type: Bottle Fed - Formula Nipple Type: Slow - flow Length of feed: 5 min  LATCH Score/Interventions                      Lactation Tools Discussed/Used Tools: Pump Breast pump type: Double-Electric Breast Pump WIC Program: No (mom hasan appointment to re-apply 3/16, and then will get a DEP) Pump Review: Setup, frequency, and cleaning;Milk Storage;Other (comment) (premie setting, hand expression , and teaching on how to provide EBM for a NICU baby) Initiated by:: bedside rnwithin 6 hours of delivery Date initiated:: 08/31/13   Consult Status Consult Status: Follow-up Date: 09/02/13 Follow-up type: In-patient    Alfred LevinsLee, Seyon Strader Anne 09/01/2013, 11:34 AM

## 2013-09-01 NOTE — Op Note (Signed)
[name] [name] 08/24/2013 - 09/01/2013  PREOPERATIVE DIAGNOSIS:  Multiparity, undesired fertility  POSTOPERATIVE DIAGNOSIS:  Multiparity, undesired fertility  PROCEDURE:  Postpartum Bilateral Tubal Sterilization using Filshie Clips   ANESTHESIA:  Spinal and local analgesia using 0.5% Marcaine  COMPLICATIONS:  None immediate.  ESTIMATED BLOOD LOSS: 5 ml.  FLUIDS:1000 ml LR.  URINE OUTPUT:  Foley placed after procedure  INDICATIONS: 28 y.o. [id number]  with undesired fertility,status post vaginal delivery, desires permanent sterilization.  Other reversible forms of contraception were discussed with patient; she declines all other modalities. Risks of procedure discussed with patient including but not limited to: risk of regret, permanence of method, bleeding, infection, injury to surrounding organs and need for additional procedures.  Failure risk of 3-09/998 with increased risk of ectopic gestation if pregnancy occurs was also discussed with patient.     FINDINGS:  Normal uterus, tubes, and ovaries.  PROCEDURE DETAILS: The patient was taken to the operating room where her epidural anesthesia was dosed up to surgical level and found to be adequate.  She was then placed in the dorsal supine position and prepped and draped in sterile fashion.  After an adequate timeout was performed, attention was turned to the patient's abdomen where a small transverse skin incision was made under the umbilical fold. The incision was taken down to the layer of fascia using the scalpel, and fascia was incised, and extended bilaterally using Mayo scissors. The peritoneum was entered in a sharp fashion. Attention was then turned to the patient's uterus, and left fallopian tube was identified and followed out to the fimbriated end.  A Filshie clip was placed on the left fallopian tube about 3 cm from the cornual attachment, with care given to incorporate the underlying mesosalpinx.  A similar process was carried out on  the right side allowing for bilateral tubal sterilization.  Good hemostasis was noted overall.  The instruments were then removed from the patient's abdomen and the fascial incision was repaired with 0 Vicryl, and the skin was closed with a 4-0 Vicryl subcuticular stitch. The patient tolerated the procedure well.  Instrument, sponge, and needle counts were correct times two.  The patient was then taken to the recovery room awake and in stable condition.

## 2013-09-01 NOTE — H&P (Signed)
  Pt seen and examined.  Chart reviewed.   Patient desires surgical management with post partum tubal ligation with Filshie clips.  The risks of surgery were discussed in detail with the patient including but not limited to: bleeding which may require transfusion or reoperation; infection which may require prolonged hospitalization or re-hospitalization and antibiotic therapy; injury to bowel, bladder, ureters and major vessels or other surrounding organs; need for additional procedures including laparotomy; thromboembolic phenomenon, incisional problems and other postoperative or anesthesia complications.  She was told that the failure rate is 3-09/998. Patient was told that the likelihood that her condition and symptoms will be treated effectively with this surgical management was very high; the postoperative expectations were also discussed in detail. The patient also understands the alternative treatment options which were discussed in full. All questions were answered.

## 2013-09-01 NOTE — Anesthesia Preprocedure Evaluation (Deleted)
Anesthesia Evaluation    Airway       Dental   Pulmonary Current Smoker,          Cardiovascular     Neuro/Psych    GI/Hepatic   Endo/Other    Renal/GU      Musculoskeletal   Abdominal   Peds  Hematology   Anesthesia Other Findings   Reproductive/Obstetrics                           Anesthesia Physical Anesthesia Plan Anesthesia Quick Evaluation  

## 2013-09-01 NOTE — Anesthesia Postprocedure Evaluation (Signed)
Anesthesia Post Note  Patient: [name] [name]  Procedure(s) Performed: Procedure(s) (LRB): POST PARTUM TUBAL LIGATION (N/A)  Anesthesia type: Spinal  Patient location: PACU  Post pain: Pain level controlled  Post assessment: Post-op Vital signs reviewed  Last Vitals:  Filed Vitals:   09/01/13 1700  BP: 109/65  Pulse: 67  Temp:   Resp: 20    Post vital signs: Reviewed  Level of consciousness: awake  Complications: No apparent anesthesia complications

## 2013-09-01 NOTE — Progress Notes (Signed)
Dr. Erin FullingHarraway-Smith notified that pt continues to c/o of increased pain after several doses of narcotic.  Abdominal distention noted soft abdomen.  Vital signs stable.  Uterus firm minimal vaginal bleeding.  No new orders received from Dr. Erin FullingHarraway-Smith will continue to monitor pt.

## 2013-09-02 ENCOUNTER — Encounter (HOSPITAL_COMMUNITY): Payer: Self-pay | Admitting: Obstetrics & Gynecology

## 2013-09-02 MED ORDER — IBUPROFEN 600 MG PO TABS: 600.0000 mg | ORAL_TABLET | Freq: Four times a day (QID) | ORAL | Status: DC

## 2013-09-02 MED ORDER — OXYCODONE-ACETAMINOPHEN 5-325 MG PO TABS: 1.0000 | ORAL_TABLET | ORAL | Status: DC | PRN

## 2013-09-02 MED ORDER — FERROUS SULFATE 325 (65 FE) MG PO TABS: 325.0000 mg | ORAL_TABLET | Freq: Every day | ORAL | Status: DC
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
## 2013-09-02 NOTE — Discharge Instructions (Signed)
Postpartum Tubal Ligation °Care After °Refer to this sheet in the next few weeks. These instructions provide you with information on caring for yourself after your procedure. Your caregiver may also give you more specific instructions. Your treatment has been planned according to current medical practices, but problems sometimes occur. Call your caregiver if you have any problems or questions after your procedure. °HOME CARE INSTRUCTIONS  °· Rest the remainder of the day. °· Only take over-the-counter or prescription medicines for pain, discomfort, or fever as directed by your caregiver. Do not take aspirin. It can cause bleeding. °· Gradually resume daily activities, diet, rest, driving, and work. °· Avoid sexual intercourse for 2 weeks or as directed. °· Do not drive while taking pain medicine. °· Do not lift anything over 5 pounds for 2 weeks or as directed. °· Only take showers, not baths, until you are seen by your caregiver. °· Change bandages (dressings) as directed. °· Take your temperature twice a day and record it. °· Try to have help for the first 7 10 days for your household needs. °· Return to your caregiver to get your stitches (sutures) removed and for follow-up visits as directed. °SEEK MEDICAL CARE IF:  °· You have redness, swelling, or increasing pain in the wound. °· You have drainage from the wound lasting longer than 1 day. °· Your pain is getting worse. °· You have a rash. °· You become dizzy or lightheaded. °· You have a reaction to your medicine. °· You need stronger medicine or a change in your pain medicine. °· You notice a bad smell coming from the wound or dressing. °· Your wound breaks open after the sutures have been removed. °· You are constipated. °SEEK IMMEDIATE MEDICAL CARE IF:  °· You faint. °· You have a fever. °· You have increasing abdominal pain. °· You have severe pain in your shoulders. °· You have bleeding or drainage from the suture sites or vagina following surgery. °· You  have shortness of breath or difficulty breathing. °· You have chest or leg pain. °· You have persistent nausea, vomiting, or diarrhea. °MAKE SURE YOU:  °· Understand these instructions. °· Watch your condition. °· Get help right away if you are not doing well or get worse. °Document Released: 11/25/2011 Document Reviewed: 11/25/2011 °ExitCare® Patient Information ©2014 [employer]. °Vaginal Delivery °Care After °Refer to this sheet in the next few weeks. These discharge instructions provide you with information on caring for yourself after delivery. Your caregiver may also give you specific instructions. Your treatment has been planned according to the most current medical practices available, but problems sometimes occur. Call your caregiver if you have any problems or questions after you go home. °HOME CARE INSTRUCTIONS °· Take over-the-counter or prescription medicines only as directed by your caregiver or pharmacist. °· Do not drink alcohol, especially if you are breastfeeding or taking medicine to relieve pain. °· Do not chew or smoke tobacco. °· Do not use illegal drugs. °· Continue to use good perineal care. Good perineal care includes: °· Wiping your perineum from front to back. °· Keeping your perineum clean. °· Do not use tampons or douche until your caregiver says it is okay. °· Shower, wash your hair, and take tub baths as directed by your caregiver. °· Wear a well-fitting bra that provides breast support. °· Eat healthy foods. °· Drink enough fluids to keep your urine clear or pale yellow. °· Eat high-fiber foods such as whole grain cereals and breads, brown rice,   beans, and fresh fruits and vegetables every day. These foods may help prevent or relieve constipation. °· Follow your cargiver's recommendations regarding resumption of activities such as climbing stairs, driving, lifting, exercising, or traveling. °· Talk to your caregiver about resuming sexual activities. Resumption of sexual activities  is dependent upon your risk of infection, your rate of healing, and your comfort and desire to resume sexual activity. °· Try to have someone help you with your household activities and your newborn for at least a few days after you leave the hospital. °· Rest as much as possible. Try to rest or take a nap when your newborn is sleeping. °· Increase your activities gradually. °· Keep all of your scheduled postpartum appointments. It is very important to keep your scheduled follow-up appointments. At these appointments, your caregiver will be checking to make sure that you are healing physically and emotionally. °SEEK MEDICAL CARE IF:  °· You are passing large clots from your vagina. Save any clots to show your caregiver. °· You have a foul smelling discharge from your vagina. °· You have trouble urinating. °· You are urinating frequently. °· You have pain when you urinate. °· You have a change in your bowel movements. °· You have increasing redness, pain, or swelling near your vaginal incision (episiotomy) or vaginal tear. °· You have pus draining from your episiotomy or vaginal tear. °· Your episiotomy or vaginal tear is separating. °· You have painful, hard, or reddened breasts. °· You have a severe headache. °· You have blurred vision or see spots. °· You feel sad or depressed. °· You have thoughts of hurting yourself or your newborn. °· You have questions about your care, the care of your newborn, or medicines. °· You are dizzy or lightheaded. °· You have a rash. °· You have nausea or vomiting. °· You were breastfeeding and have not had a menstrual period within 12 weeks after you stopped breastfeeding. °· You are not breastfeeding and have not had a menstrual period by the 12th week after delivery. °· You have a fever. °SEEK IMMEDIATE MEDICAL CARE IF:  °· You have persistent pain. °· You have chest pain. °· You have shortness of breath. °· You faint. °· You have leg pain. °· You have stomach pain. °· Your  vaginal bleeding saturates two or more sanitary pads in 1 hour. °MAKE SURE YOU:  °· Understand these instructions. °· Will watch your condition. °· Will get help right away if you are not doing well or get worse. °Document Released: 05/23/2000 Document Revised: 02/18/2012 Document Reviewed: 01/21/2012 °ExitCare® Patient Information ©2014 [employer]. ° °

## 2013-09-02 NOTE — Progress Notes (Signed)
Pt given discharge instructions and signed paperwork. Pt waiting on ride to go home. Pt visiting infant in NICU until transportation arrives. Belongings left in GYN room and pt understands that she needs to notify nursing staff when she is leaving.

## 2013-09-02 NOTE — Progress Notes (Signed)
Post Partum Day 2/POD#1 Subjective: up ad lib, voiding, tolerating PO, + flatus and crampy abdominal pain controlled with meds  Objective: Blood pressure 114/63, pulse 68, temperature 98.1 F (36.7 C), temperature source Oral, resp. rate 15, height 5\' 9"  (1.753 m), weight 61.009 kg (134 lb 8 oz), SpO2 100.00%, unknown if currently breastfeeding.  Physical Exam:  General: alert, cooperative and no distress Lochia: appropriate Uterine Fundus: firm Incision: healing well DVT Evaluation: No evidence of DVT seen on physical exam. Negative Homan's sign. No cords or calf tenderness. No significant calf/ankle edema.   Recent Labs  08/31/13 1320 09/01/13 0535  HGB 9.7* 8.3*  HCT 29.5* 23.6*    Assessment/Plan: Discharge home and Social Work consult pending SW approval.    LOS: 9 days   Michaelene SongHall, Jonathan C 09/02/2013, 9:26 AM   Please see separate d/c summary today.   I have seen and examined this patient and agree with above documentation in the resident's note.   Rulon AbideKeli Rayn Shorb, M.D. [facility] Fellow 09/02/2013 10:42 AM
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
## 2013-09-02 NOTE — Lactation Note (Signed)
This note was copied from the chart of [name] [name]. Lactation Consultation Note  Follow up consult with this mom of a NICU baby, now 3139 hours old, and 35 2/7 weeks corrected gestation. Mom reports her milk supply beginning to transition in. I [facility]ed her [facility] I will follow this family in the nICU.  Patient Name: [name] Sharene Skeanslysia Sieh ZOXWR'UToday's Date: 09/02/2013 Reason for consult: Follow-up assessment;NICU baby   Maternal Data    Feeding Feeding Type: Formula Nipple Type: Slow - flow Length of feed: 10 min  LATCH Score/Interventions                      Lactation Tools Discussed/Used     Consult Status Consult Status: PRN Follow-up type:  (in NICU)    Alfred LevinsLee, Zanasia Hickson Anne 09/02/2013, 11:58 AM

## 2013-09-02 NOTE — Discharge Summary (Signed)
Attestation of Attending Supervision of Advanced Practitioner (CNM/NP): Evaluation and management procedures were performed by the Advanced Practitioner under my supervision and collaboration.  I have reviewed the Advanced Practitioner's note and chart, and I agree with the management and plan.  HARRAWAY-SMITH, Delfin Squillace 4:14 PM

## 2013-09-05 ENCOUNTER — Encounter: Payer: Self-pay | Admitting: *Deleted

## 2013-09-05 ENCOUNTER — Ambulatory Visit: Payer: Self-pay

## 2013-09-05 NOTE — Lactation Note (Signed)
This note was copied from the chart of [name] Sharene Skeanslysia Bara. Lactation Consultation Note    Follow up consult with this mom of a NICU baby, now 305days old, and 35 5/7 weeks corrected gestation. Mom is bringing in 2  3-4  ounce bottle of milk. She is not pumping every 3 hours, 8 times a day. I reviewed with mom how if she does not increase her frequency, she will quickly lose her milk supply. Mom said she will increase. I assisted mom with latching baby in cross cradle hold, and explained how this and football hold were old she should be using for a premature baby, until the baby can latch on her own. I showed mom how this latch protects the baby's airway. The baby was latching on her own, vigorous sucking. I told mom that one day this week, I will do a pre and post weight, and see how much the baby is transferring, if any. I also reviewed with mom that unlike her other 2 children, this baby was a late pre term baby, and how this effects her milk supply.   Mom knows to call when she visits, for latching and pumping help. I will follow this family in the NICU.  Patient Name: [name] Sharene Skeanslysia Shear WUJWJ'XToday's Date: 09/05/2013 Reason for consult: Follow-up assessment;Late preterm infant;NICU baby   Maternal Data    Feeding Feeding Type: Breast Fed Length of feed: 20 min  LATCH Score/Interventions Latch: Grasps breast easily, tongue down, lips flanged, rhythmical sucking.  Audible Swallowing: None Intervention(s): Skin to skin;Hand expression  Type of Nipple: Everted at rest and after stimulation  Comfort (Breast/Nipple): Soft / non-tender (large but baby can latch and get just beyond the nipple)     Hold (Positioning): Assistance needed to correctly position infant at breast and maintain latch.  LATCH Score: 7  Lactation Tools Discussed/Used     Consult Status Consult Status: Follow-up Follow-up type: In-patient (nicu prn)    [name] [name], [name] [name] 09/05/2013, 4:15 PM

## 2013-09-07 ENCOUNTER — Ambulatory Visit (HOSPITAL_COMMUNITY): Payer: Self-pay

## 2013-09-07 ENCOUNTER — Encounter: Payer: Self-pay | Admitting: Advanced Practice Midwife

## 2013-09-07 ENCOUNTER — Ambulatory Visit: Payer: Self-pay

## 2013-09-07 NOTE — Lactation Note (Signed)
This note was copied from the chart of [name] Sharene Skeanslysia Bass. Lactation Consultation Note    Pre and post weight done - baby  Eager to breast feed, now 677 days old, and 36 weeks corrected gestation. I did a pre and post weight, and she transferred 11 mls at the breast. She weighs 4 lbs 1.5 oz . Mom has large nipples, but baby is able to latch just beyond nipple. I explained to mom that due to her small size and late pre term gestation, she can not transfer a full feed at this time, but otherwise did great. I feel she will eventually be fully breast feeding, if mom continue to work with her to transition. Mom has a great milk supply. Cross cradle hold and bring the baby to her with latch, were reinforced with mom. i will follow this family in the nICU.  Patient Name: [name] [name] ZOXWR'UToday's Date: 09/07/2013 Reason for consult: Follow-up assessment;NICU baby;Infant < 6lbs   Maternal Data    Feeding Feeding Type: Breast Milk Length of feed:  (20 minutes at the breast)  LATCH Score/Interventions Latch: Grasps breast easily, tongue down, lips flanged, rhythmical sucking. Intervention(s): Adjust position;Assist with latch;Breast massage;Breast compression  Audible Swallowing: A few with stimulation Intervention(s): Hand expression  Type of Nipple: Everted at rest and after stimulation  Comfort (Breast/Nipple): Soft / non-tender     Hold (Positioning): Assistance needed to correctly position infant at breast and maintain latch. Intervention(s): Breastfeeding basics reviewed;Support Pillows;Position options  LATCH Score: 8  Lactation Tools Discussed/Used     Consult Status Consult Status: Follow-up Follow-up type: In-patient (NICU)    Alfred LevinsLee, [name] [name] 09/07/2013, 9:43 AM

## 2013-09-29 ENCOUNTER — Ambulatory Visit (INDEPENDENT_AMBULATORY_CARE_PROVIDER_SITE_OTHER): Payer: Medicaid Other | Admitting: Obstetrics & Gynecology

## 2013-09-29 ENCOUNTER — Encounter: Payer: Self-pay | Admitting: Obstetrics & Gynecology

## 2013-09-29 NOTE — Progress Notes (Signed)
Patient ID: [name] [name], female   DOB: 02-12-1986, 28 y.o.   MRN: [id number] Subjective:     [name] [name] is a 28 y.o. female who presents for a postpartum visit. She is 6 weeks postpartum following a spontaneous vaginal delivery. I have fully reviewed the prenatal and intrapartum course. The delivery was at 36 gestational weeks. Outcome: spontaneous vaginal delivery.  Postpartum course has been uncompliated. Baby's course has been complicated with a NICU stay she is supposed to be discharged in am. Baby is feeding by formula. Bleeding no bleeding. Bowel function is normal. Bladder function is normal. Patient is not sexually active. Contraception method is abstinence. Postpartum depression screening: negative.  The following portions of the patient's history were reviewed and updated as appropriate: allergies, current medications, past family history, past medical history, past social history, past surgical history and problem list.  Pt spouse has been diagnosed per pt with stomach cancer. Currently has feeding tube. Pt reports that she also has a sick child at home.  She reports that this is her usually state and she rpeorts tha tshe is not depressed as a result of this.  Review of Systems Pt c/o hemorrhoids   Objective:    BP 109/75  Pulse 95  Ht 5\' 9"  (1.753 m)  Wt 119 lb 11.2 oz (54.296 kg)  BMI 17.67 kg/m2  Breastfeeding? No  General:  alert and no distress           Abdomen: soft, non-tender; bowel sounds normal; no masses,  no organomegaly and suture cut from umbilical incision    Vulva:  not evaluated                       Assessment:     4-6 weeks postpartum exam. Pap smear not done at today's visit.   Plan:    1. Contraception: tubal ligation 2. rec f/u in 1 year for annual

## 2013-09-29 NOTE — Patient Instructions (Signed)
Postpartum Tubal Ligation Care After Refer to this sheet in the next few weeks. These instructions provide you with information on caring for yourself after your procedure. Your caregiver may also give you more specific instructions. Your treatment has been planned according to current medical practices, but problems sometimes occur. Call your caregiver if you have any problems or questions after your procedure. HOME CARE INSTRUCTIONS   Rest the remainder of the day.  Only take over-the-counter or prescription medicines for pain, discomfort, or fever as directed by your caregiver. Do not take aspirin. It can cause bleeding.  Gradually resume daily activities, diet, rest, driving, and work.  Avoid sexual intercourse for 2 weeks or as directed.  Do not drive while taking pain medicine.  Do not lift anything over 5 pounds for 2 weeks or as directed.  Only take showers, not baths, until you are seen by your caregiver.  Change bandages (dressings) as directed.  Take your temperature twice a day and record it.  Try to have help for the first 7 10 days for your household needs.  Return to your caregiver to get your stitches (sutures) removed and for follow-up visits as directed. SEEK MEDICAL CARE IF:   You have redness, swelling, or increasing pain in the wound.  You have drainage from the wound lasting longer than 1 day.  Your pain is getting worse.  You have a rash.  You become dizzy or lightheaded.  You have a reaction to your medicine.  You need stronger medicine or a change in your pain medicine.  You notice a bad smell coming from the wound or dressing.  Your wound breaks open after the sutures have been removed.  You are constipated. SEEK IMMEDIATE MEDICAL CARE IF:   You faint.  You have a fever.  You have increasing abdominal pain.  You have severe pain in your shoulders.  You have bleeding or drainage from the suture sites or vagina following surgery.  You  have shortness of breath or difficulty breathing.  You have chest or leg pain.  You have persistent nausea, vomiting, or diarrhea. MAKE SURE YOU:   Understand these instructions.  Watch your condition.  Get help right away if you are not doing well or get worse. Document Released: 11/25/2011 Document Reviewed: 11/25/2011 [facility] Patient Information 2014 [employer] [place], [place].

## 2013-11-08 ENCOUNTER — Encounter: Payer: Self-pay | Admitting: *Deleted

## 2014-02-21 ENCOUNTER — Emergency Department (HOSPITAL_COMMUNITY)
Admission: EM | Admit: 2014-02-21 | Discharge: 2014-02-21 | Disposition: A | Discharge status: Home / Self Care | Payer: Medicaid Other | Source: Home / Self Care | Attending: Family Medicine | Admitting: Family Medicine

## 2014-02-21 ENCOUNTER — Encounter (HOSPITAL_COMMUNITY): Payer: Self-pay | Admitting: Emergency Medicine

## 2014-02-21 ENCOUNTER — Emergency Department (INDEPENDENT_AMBULATORY_CARE_PROVIDER_SITE_OTHER): Payer: Self-pay

## 2014-02-21 DIAGNOSIS — S40019A Contusion of unspecified shoulder, initial encounter: Secondary | ICD-10-CM

## 2014-02-21 DIAGNOSIS — S40011A Contusion of right shoulder, initial encounter: Secondary | ICD-10-CM

## 2014-02-21 MED ORDER — IBUPROFEN 600 MG PO TABS: 600.0000 mg | ORAL_TABLET | Freq: Four times a day (QID) | ORAL | Status: DC | PRN

## 2014-02-21 NOTE — Discharge Instructions (Signed)
RICE: Routine Care for Injuries  The routine care of many injuries includes Rest, Ice, Compression, and Elevation (RICE).  HOME CARE INSTRUCTIONS  · Rest is needed to allow your body to heal. Routine activities can usually be resumed when comfortable. Injured tendons and bones can take up to 6 weeks to heal. Tendons are the cord-like structures that attach muscle to bone.  · Ice following an injury helps keep the swelling down and reduces pain.  ¨ Put ice in a plastic bag.  ¨ Place a towel between your skin and the bag.  ¨ Leave the ice on for 15-20 minutes, 3-4 times a day, or as directed by your health care provider. Do this while awake, for the first 24 to 48 hours. After that, continue as directed by your caregiver.  · Compression helps keep swelling down. It also gives support and helps with discomfort. If an elastic bandage has been applied, it should be removed and reapplied every 3 to 4 hours. It should not be applied tightly, but firmly enough to keep swelling down. Watch fingers or toes for swelling, bluish discoloration, coldness, numbness, or excessive pain. If any of these problems occur, remove the bandage and reapply loosely. Contact your caregiver if these problems continue.  · Elevation helps reduce swelling and decreases pain. With extremities, such as the arms, hands, legs, and feet, the injured area should be placed near or above the level of the heart, if possible.  SEEK IMMEDIATE MEDICAL CARE IF:  · You have persistent pain and swelling.  · You develop redness, numbness, or unexpected weakness.  · Your symptoms are getting worse rather than improving after several days.  These symptoms may indicate that further evaluation or further X-rays are needed. Sometimes, X-rays may not show a small broken bone (fracture) until 1 week or 10 days later. Make a follow-up appointment with your caregiver. Ask when your X-ray results will be ready. Make sure you get your X-ray results.  Document Released:  09/07/2000 Document Revised: 05/31/2013 Document Reviewed: 10/25/2010  ExitCare® Patient Information ©2015 [employer]. This information is not intended to replace advice given to you by your health care provider. Make sure you discuss any questions you have with your health care provider.  Contusion  A contusion is a deep bruise. Contusions are the result of an injury that caused bleeding under the skin. The contusion may turn blue, purple, or yellow. Minor injuries will give you a painless contusion, but more severe contusions may stay painful and swollen for a few weeks.   CAUSES   A contusion is usually caused by a blow, trauma, or direct force to an area of the body.  SYMPTOMS   · Swelling and redness of the injured area.  · Bruising of the injured area.  · Tenderness and soreness of the injured area.  · Pain.  DIAGNOSIS   The diagnosis can be made by taking a history and physical exam. An X-ray, CT scan, or MRI may be needed to determine if there were any associated injuries, such as fractures.  TREATMENT   Specific treatment will depend on what area of the body was injured. In general, the best treatment for a contusion is resting, icing, elevating, and applying cold compresses to the injured area. Over-the-counter medicines may also be recommended for pain control. Ask your caregiver what the best treatment is for your contusion.  HOME CARE INSTRUCTIONS   · Put ice on the injured area.  · Put ice in   a plastic bag.  · Place a towel between your skin and the bag.  · Leave the ice on for 15-20 minutes, 3-4 times a day, or as directed by your health care provider.  · Only take over-the-counter or prescription medicines for pain, discomfort, or fever as directed by your caregiver. Your caregiver may recommend avoiding anti-inflammatory medicines (aspirin, ibuprofen, and naproxen) for 48 hours because these medicines may increase bruising.  · Rest the injured area.  · If possible, elevate the injured area to  reduce swelling.  SEEK IMMEDIATE MEDICAL CARE IF:   · You have increased bruising or swelling.  · You have pain that is getting worse.  · Your swelling or pain is not relieved with medicines.  MAKE SURE YOU:   · Understand these instructions.  · Will watch your condition.  · Will get help right away if you are not doing well or get worse.  Document Released: 03/05/2005 Document Revised: 05/31/2013 Document Reviewed: 03/31/2011  ExitCare® Patient Information ©2015 [employer] LLC. This information is not intended to replace advice given to you by your health care provider. Make sure you discuss any questions you have with your health care provider.
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
## 2014-02-24 NOTE — ED Provider Notes (Signed)
Medical screening examination/treatment/procedure(s) were performed by resident physician or non-physician practitioner and as supervising physician I was immediately available for consultation/collaboration.   KINDL,JAMES DOUGLAS MD.   James D Kindl, MD 02/24/14 1639 

## 2014-04-10 ENCOUNTER — Encounter (HOSPITAL_COMMUNITY): Payer: Self-pay | Admitting: Emergency Medicine

## 2014-09-04 ENCOUNTER — Emergency Department (HOSPITAL_COMMUNITY)
Admission: EM | Admit: 2014-09-04 | Discharge: 2014-09-04 | Disposition: A | Discharge status: Home / Self Care | Payer: Medicaid Other | Attending: Emergency Medicine | Admitting: Emergency Medicine

## 2014-09-04 ENCOUNTER — Encounter (HOSPITAL_COMMUNITY): Payer: Self-pay | Admitting: Emergency Medicine

## 2014-09-04 ENCOUNTER — Emergency Department (HOSPITAL_COMMUNITY): Payer: Medicaid Other

## 2014-09-04 DIAGNOSIS — X58XXXA Exposure to other specified factors, initial encounter: Secondary | ICD-10-CM | POA: Diagnosis not present

## 2014-09-04 DIAGNOSIS — Z8739 Personal history of other diseases of the musculoskeletal system and connective tissue: Secondary | ICD-10-CM | POA: Insufficient documentation

## 2014-09-04 DIAGNOSIS — M25561 Pain in right knee: Secondary | ICD-10-CM

## 2014-09-04 DIAGNOSIS — Y9339 Activity, other involving climbing, rappelling and jumping off: Secondary | ICD-10-CM | POA: Insufficient documentation

## 2014-09-04 DIAGNOSIS — Z8619 Personal history of other infectious and parasitic diseases: Secondary | ICD-10-CM | POA: Insufficient documentation

## 2014-09-04 DIAGNOSIS — Z720 Tobacco use: Secondary | ICD-10-CM | POA: Diagnosis not present

## 2014-09-04 DIAGNOSIS — Z8541 Personal history of malignant neoplasm of cervix uteri: Secondary | ICD-10-CM | POA: Insufficient documentation

## 2014-09-04 DIAGNOSIS — Y998 Other external cause status: Secondary | ICD-10-CM | POA: Diagnosis not present

## 2014-09-04 DIAGNOSIS — Y9289 Other specified places as the place of occurrence of the external cause: Secondary | ICD-10-CM | POA: Diagnosis not present

## 2014-09-04 DIAGNOSIS — Z872 Personal history of diseases of the skin and subcutaneous tissue: Secondary | ICD-10-CM | POA: Diagnosis not present

## 2014-09-04 DIAGNOSIS — S4991XA Unspecified injury of right shoulder and upper arm, initial encounter: Secondary | ICD-10-CM | POA: Insufficient documentation

## 2014-09-04 MED ORDER — HYDROCODONE-ACETAMINOPHEN 5-325 MG PO TABS
1.0000 | ORAL_TABLET | Freq: Once | ORAL | Status: AC
  Administered 2014-09-04: 1 via ORAL
  Filled 2014-09-04: qty 1

## 2014-09-04 MED ORDER — IBUPROFEN 800 MG PO TABS: 800.0000 mg | ORAL_TABLET | Freq: Three times a day (TID) | ORAL | Status: DC

## 2014-09-04 MED ORDER — HYDROCODONE-ACETAMINOPHEN 5-325 MG PO TABS: 1.0000 | ORAL_TABLET | ORAL | Status: DC | PRN

## 2014-09-04 NOTE — ED Provider Notes (Signed)
CSN: [id number]     Arrival date & time 09/04/14  1929 History  This chart was scribed for non-physician practitioner, Elpidio AnisShari Carrol Hougland, PA-C,working with Blake DivineJohn Wofford, MD, by Karle PlumberJennifer Tensley, ED Scribe. This patient was seen in room WTR8/WTR8 and the patient's care was started at 9:28 PM.  Chief Complaint  Patient presents with  . Knee Pain   Patient is a 29 y.o. female presenting with knee pain. The history is provided by the patient and medical records. No language interpreter was used.  Knee Pain   HPI Comments:  [name] [name] is a 29 y.o. female who presents to the Emergency Department complaining of severe right knee pain for the past three days. Pt states she jumped up and when her feet hit the ground she collapsed to the ground due to the severity of the pain. She has wrapped the knee with a brace and taking Ibuprofen with minimal relief of the pain. Bearing weight and extending the leg makes the knee pain worse. Denies alleviating factors. Denies numbness, tingling or weakness of the RLE, bruising or wounds. PMHx of CIN III, HSV, scoliosis and morphea. She denies allergies to any medications.   Past Medical History  Diagnosis Date  . CIN III (cervical intraepithelial neoplasia grade III) with severe dysplasia     2009  . Genital HSV   . Scoliosis   . Morphea   . Marijuana use 07/21/2013    Positive initial urine drug screen this pregnancy    Past Surgical History  Procedure Laterality Date  . No past surgeries    . Tubal ligation N/A 09/01/2013    Procedure: POST PARTUM TUBAL LIGATION;  Surgeon: Willodean Rosenthalarolyn Harraway-Smith, MD;  Location: [facility];  Service: Gynecology;  Laterality: N/A;  with filsie clips   Family History  Problem Relation Age of Onset  . Diabetes Other   . Hypertension Other    History  Substance Use Topics  . Smoking status: Current Every Day Smoker -- 0.25 packs/day  . Smokeless tobacco: Never Used  . Alcohol Use: No   OB History    Gravida Para Term  Preterm AB TAB SAB Ectopic Multiple Living   3 3 2 1  0 0 0 0 0 3      Obstetric Comments   Avoided epidural with second baby, as pt has had "chronic back problems" perceived as worse after delivery with epidural     Review of Systems  Musculoskeletal: Positive for arthralgias.  Skin: Negative for color change and wound.  Neurological: Negative for weakness and numbness.    Allergies  Review of patient's allergies indicates no known allergies.  Home Medications   Prior to Admission medications   Medication Sig Start Date End Date Taking? Authorizing Provider  acetaminophen (TYLENOL) 325 MG tablet Take 325 mg by mouth every 6 (six) hours as needed for moderate pain (pain).   Yes Historical Provider, MD  ibuprofen (ADVIL,MOTRIN) 200 MG tablet Take 200 mg by mouth every 6 (six) hours as needed for moderate pain (pain).   Yes Historical Provider, MD  ferrous sulfate 325 (65 FE) MG tablet Take 1 tablet (325 mg total) by mouth daily with breakfast. Patient not taking: Reported on 09/04/2014 09/02/13   Vale HavenKeli L Beck, MD  ibuprofen (ADVIL,MOTRIN) 600 MG tablet Take 1 tablet (600 mg total) by mouth every 6 (six) hours as needed. Patient not taking: Reported on 09/04/2014 02/21/14   Narda Bondsalph A Nettey, MD  oxyCODONE-acetaminophen (PERCOCET/ROXICET) 5-325 MG per tablet Take 1-2 tablets  by mouth every 4 (four) hours as needed for severe pain (moderate - severe pain). Patient not taking: Reported on 09/04/2014 09/02/13   Sandrea Matte, MD  Prenatal Vit-Fe Fumarate-FA (PRENATAL VITAMINS PLUS) 27-1 MG TABS Take 1 tablet by mouth daily. Patient not taking: Reported on 09/04/2014 08/23/13   Vale Haven, MD   Triage Vitals: BP 125/74 mmHg  Pulse 88  Temp(Src) 98.4 F (36.9 C) (Oral)  SpO2 100%  LMP 08/12/2014 Physical Exam  Constitutional: She is oriented to person, place, and time. She appears well-developed and well-nourished.  HENT:  Head: Normocephalic and atraumatic.  Eyes: EOM are normal.  Neck:  Normal range of motion.  Cardiovascular: Normal rate and intact distal pulses.   Intact distal pulses.  Pulmonary/Chest: Effort normal.  Musculoskeletal: Normal range of motion.  Right knee without swelling. Joint stable. No effusion. No calf or thigh tenderness.  Neurological: She is alert and oriented to person, place, and time.  Skin: Skin is warm and dry.  Psychiatric: She has a normal mood and affect. Her behavior is normal.  Nursing note and vitals reviewed.   ED Course  Procedures (including critical care time) DIAGNOSTIC STUDIES: Oxygen Saturation is 100% on RA, normal by my interpretation.   COORDINATION OF CARE: 9:32 PM- Will order knee immobilizer and crutches per pt request. Will prescribe pain medication and refer to orthopedist. Pt verbalizes understanding and agrees to plan.  Medications - No data to display  Labs Review Labs Reviewed - No data to display  Imaging Review Dg Knee Complete 4 Views Right  09/04/2014   CLINICAL DATA:  29 year old female with acute onset knee pain after landing hard. Initial encounter.  EXAM: RIGHT KNEE - COMPLETE 4+ VIEW  COMPARISON:  None.  FINDINGS: No joint effusion identified. Joint spaces are preserved. Patella intact. 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 No acute fracture or dislocation identified.  IMPRESSION: No acute osseous abnormality identified about the right knee.   Electronically Signed   By: Odessa Fleming M.D.   On: 09/04/2014 21:10     EKG Interpretation None      MDM   Final diagnoses:  None    1. Right knee sprain  Negative imaging. DDx: sprain vs ligament injury. Knee stable on exam, suspect sprain. Knee immobilizer and crutches provided. Ortho referral as needed if pain continues.   I personally performed the services described in this documentation, which was scribed in my presence. The recorded information has been reviewed and is  accurate.    Elpidio Anis, PA-C 09/05/14 2231  Blake Divine, MD 09/05/14 [phone]

## 2014-09-04 NOTE — ED Notes (Signed)
Pt states she jumped up and came down and her R knee gave way on Friday. OTC meds and knee brace not helping. Alert and oriented.

## 2014-09-04 NOTE — Discharge Instructions (Signed)
Cryotherapy °Cryotherapy means treatment with cold. Ice or gel packs can be used to reduce both pain and swelling. Ice is the most helpful within the first 24 to 48 hours after an injury or flare-up from overusing a muscle or joint. Sprains, strains, spasms, burning pain, shooting pain, and aches can all be eased with ice. Ice can also be used when recovering from surgery. Ice is effective, has very few side effects, and is safe for most people to use. °PRECAUTIONS  °Ice is not a safe treatment option for people with: °· Raynaud phenomenon. This is a condition affecting small blood vessels in the extremities. Exposure to cold may cause your problems to return. °· Cold hypersensitivity. There are many forms of cold hypersensitivity, including: °· Cold urticaria. Red, itchy hives appear on the skin when the tissues begin to warm after being iced. °· Cold erythema. This is a red, itchy rash caused by exposure to cold. °· Cold hemoglobinuria. Red blood cells break down when the tissues begin to warm after being iced. The hemoglobin that carry oxygen are passed into the urine because they cannot combine with blood proteins fast enough. °· Numbness or altered sensitivity in the area being iced. °If you have any of the following conditions, do not use ice until you have discussed cryotherapy with your caregiver: °· Heart conditions, such as arrhythmia, angina, or chronic heart disease. °· High blood pressure. °· Healing wounds or open skin in the area being iced. °· Current infections. °· Rheumatoid arthritis. °· Poor circulation. °· Diabetes. °Ice slows the blood flow in the region it is applied. This is beneficial when trying to stop inflamed tissues from spreading irritating chemicals to surrounding tissues. However, if you expose your skin to cold temperatures for too long or without the proper protection, you can damage your skin or nerves. Watch for signs of skin damage due to cold. °HOME CARE INSTRUCTIONS °Follow  these tips to use ice and cold packs safely. °· Place a dry or damp towel between the ice and skin. A damp towel will cool the skin more quickly, so you may need to shorten the time that the ice is used. °· For a more rapid response, add gentle compression to the ice. °· Ice for no more than 10 to 20 minutes at a time. The bonier the area you are icing, the less time it will take to get the benefits of ice. °· Check your skin after 5 minutes to make sure there are no signs of a poor response to cold or skin damage. °· Rest 20 minutes or more between uses. °· Once your skin is numb, you can end your treatment. You can test numbness by very lightly touching your skin. The touch should be so light that you do not see the skin dimple from the pressure of your fingertip. When using ice, most people will feel these normal sensations in this order: cold, burning, aching, and numbness. °· Do not use ice on someone who cannot communicate their responses to pain, such as small children or people with dementia. °HOW TO MAKE AN ICE PACK °Ice packs are the most common way to use ice therapy. Other methods include ice massage, ice baths, and cryosprays. Muscle creams that cause a cold, tingly feeling do not offer the same benefits that ice offers and should not be used as a substitute unless recommended by your caregiver. °To make an ice pack, do one of the following: °· Place crushed ice or a   bag of frozen vegetables in a sealable plastic bag. Squeeze out the excess air. Place this bag inside another plastic bag. Slide the bag into a pillowcase or place a damp towel between your skin and the bag. °· Mix 3 parts water with 1 part rubbing alcohol. Freeze the mixture in a sealable plastic bag. When you remove the mixture from the freezer, it will be slushy. Squeeze out the excess air. Place this bag inside another plastic bag. Slide the bag into a pillowcase or place a damp towel between your skin and the bag. °SEEK MEDICAL CARE  IF: °· You develop white spots on your skin. This may give the skin a blotchy (mottled) appearance. °· Your skin turns blue or pale. °· Your skin becomes waxy or hard. °· Your swelling gets worse. °MAKE SURE YOU:  °· Understand these instructions. °· Will watch your condition. °· Will get help right away if you are not doing well or get worse. °Document Released: 01/20/2011 Document Revised: 10/10/2013 Document Reviewed: 01/20/2011 °[employer]® Patient Information ©2015 [employer]. This information is not intended to replace advice given to you by your health care provider. Make sure you discuss any questions you have with your health care provider. °Knee Sprain °A knee sprain is a tear in one of the strong, fibrous tissues that connect the bones (ligaments) in your knee. The severity of the sprain depends on how much of the ligament is torn. The tear can be either partial or complete. °CAUSES  °Often, sprains are a result of a fall or injury. The force of the impact causes the fibers of your ligament to stretch too much. This excess tension causes the fibers of your ligament to tear. °SIGNS AND SYMPTOMS  °You may have some loss of motion in your knee. Other symptoms include: °· Bruising. °· Pain in the knee area. °· Tenderness of the knee to the touch. °· Swelling. °DIAGNOSIS  °To diagnose a knee sprain, your health care provider will physically examine your knee. Your health care provider may also suggest an X-ray exam of your knee to make sure no bones are broken. °TREATMENT  °If your ligament is only partially torn, treatment usually involves keeping the knee in a fixed position (immobilization) or bracing your knee for activities that require movement for several weeks. To do this, your health care provider will apply a bandage, cast, or splint to keep your knee from moving and to support your knee during movement until it heals. For a partially torn ligament, the healing process usually takes 4-6 weeks. °If  your ligament is completely torn, depending on which ligament it is, you may need surgery to reconnect the ligament to the bone or reconstruct it. After surgery, a cast or splint may be applied and will need to stay on your knee for 4-6 weeks while your ligament heals. °HOME CARE INSTRUCTIONS °· Keep your injured knee elevated to decrease swelling. °· To ease pain and swelling, apply ice to the injured area: °¨ Put ice in a plastic bag. °¨ Place a towel between your skin and the bag. °¨ Leave the ice on for 20 minutes, 2-3 times a day. °· Only take medicine for pain as directed by your health care provider. °· Do not leave your knee unprotected until pain and stiffness go away (usually 4-6 weeks). °· If you have a cast or splint, do not allow it to get wet. If you have been instructed not to remove it, cover it with a plastic   bag when you shower or bathe. Do not swim. °· Your health care provider may suggest exercises for you to do during your recovery to prevent or limit permanent weakness and stiffness. °SEEK IMMEDIATE MEDICAL CARE IF: °· Your cast or splint becomes damaged. °· Your pain becomes worse. °· You have significant pain, swelling, or numbness below the cast or splint. °MAKE SURE YOU: °· Understand these instructions. °· Will watch your condition. °· Will get help right away if you are not doing well or get worse. °Document Released: 05/26/2005 Document Revised: 03/16/2013 Document Reviewed: 01/05/2013 °[employer]® Patient Information ©2015 [employer]. This information is not intended to replace advice given to you by your health care provider. Make sure you discuss any questions you have with your health care provider. ° °

## 2014-11-29 ENCOUNTER — Encounter (HOSPITAL_COMMUNITY): Payer: Self-pay | Admitting: Emergency Medicine

## 2014-11-29 ENCOUNTER — Emergency Department (HOSPITAL_COMMUNITY)
Admission: EM | Admit: 2014-11-29 | Discharge: 2014-11-29 | Disposition: A | Discharge status: Home / Self Care | Payer: Medicaid Other | Attending: Emergency Medicine | Admitting: Emergency Medicine

## 2014-11-29 ENCOUNTER — Emergency Department (HOSPITAL_COMMUNITY): Payer: Medicaid Other

## 2014-11-29 DIAGNOSIS — Z872 Personal history of diseases of the skin and subcutaneous tissue: Secondary | ICD-10-CM | POA: Diagnosis not present

## 2014-11-29 DIAGNOSIS — M6281 Muscle weakness (generalized): Secondary | ICD-10-CM | POA: Insufficient documentation

## 2014-11-29 DIAGNOSIS — Z79899 Other long term (current) drug therapy: Secondary | ICD-10-CM | POA: Diagnosis not present

## 2014-11-29 DIAGNOSIS — F419 Anxiety disorder, unspecified: Secondary | ICD-10-CM | POA: Diagnosis not present

## 2014-11-29 DIAGNOSIS — R197 Diarrhea, unspecified: Secondary | ICD-10-CM | POA: Diagnosis not present

## 2014-11-29 DIAGNOSIS — R05 Cough: Secondary | ICD-10-CM | POA: Diagnosis not present

## 2014-11-29 DIAGNOSIS — Z3202 Encounter for pregnancy test, result negative: Secondary | ICD-10-CM | POA: Insufficient documentation

## 2014-11-29 DIAGNOSIS — R5383 Other fatigue: Secondary | ICD-10-CM | POA: Diagnosis present

## 2014-11-29 DIAGNOSIS — R059 Cough, unspecified: Secondary | ICD-10-CM

## 2014-11-29 DIAGNOSIS — R630 Anorexia: Secondary | ICD-10-CM | POA: Diagnosis not present

## 2014-11-29 DIAGNOSIS — Z720 Tobacco use: Secondary | ICD-10-CM | POA: Insufficient documentation

## 2014-11-29 DIAGNOSIS — Z8619 Personal history of other infectious and parasitic diseases: Secondary | ICD-10-CM | POA: Insufficient documentation

## 2014-11-29 DIAGNOSIS — R6883 Chills (without fever): Secondary | ICD-10-CM | POA: Insufficient documentation

## 2014-11-29 DIAGNOSIS — R634 Abnormal weight loss: Secondary | ICD-10-CM | POA: Insufficient documentation

## 2014-11-29 DIAGNOSIS — Z8541 Personal history of malignant neoplasm of cervix uteri: Secondary | ICD-10-CM | POA: Insufficient documentation

## 2014-11-29 LAB — BASIC METABOLIC PANEL
ANION GAP: 7 (ref 5–15)
BUN: 11 mg/dL (ref 6–20)
CHLORIDE: 104 mmol/L (ref 101–111)
CO2: 24 mmol/L (ref 22–32)
CREATININE: 0.69 mg/dL (ref 0.44–1.00)
Calcium: 8.9 mg/dL (ref 8.9–10.3)
Glucose, Bld: 155 mg/dL — ABNORMAL HIGH (ref 65–99)
Potassium: 3.5 mmol/L (ref 3.5–5.1)
Sodium: 135 mmol/L (ref 135–145)

## 2014-11-29 LAB — CBC
HCT: 37.9 % (ref 36.0–46.0)
Hemoglobin: 12.6 g/dL (ref 12.0–15.0)
MCH: 27.8 pg (ref 26.0–34.0)
MCHC: 33.2 g/dL (ref 30.0–36.0)
MCV: 83.5 fL (ref 78.0–100.0)
Platelets: 197 10*3/uL (ref 150–400)
RBC: 4.54 MIL/uL (ref 3.87–5.11)
RDW: 14.2 % (ref 11.5–15.5)
WBC: 5.1 10*3/uL (ref 4.0–10.5)

## 2014-11-29 LAB — CBG MONITORING, ED: GLUCOSE-CAPILLARY: 151 mg/dL — AB (ref 65–99)

## 2014-11-29 LAB — POC URINE PREG, ED: PREG TEST UR: NEGATIVE

## 2014-11-29 MED ORDER — LORAZEPAM 1 MG PO TABS: 1.0000 mg | ORAL_TABLET | Freq: Four times a day (QID) | ORAL | Status: DC | PRN

## 2014-11-29 MED ORDER — LORAZEPAM 1 MG PO TABS
1.0000 mg | ORAL_TABLET | Freq: Once | ORAL | Status: AC
  Administered 2014-11-29: 1 mg via ORAL
  Filled 2014-11-29: qty 1

## 2014-11-29 NOTE — ED Notes (Signed)
Pt states she began coughing over the weekend, increasing into fatigue and diarrhea over the week. States she's felt disoriented, and has been having chills/hot flashes. Denies nausea and vomiting. States she's lost 10 lbs over the last two months, and has been under increased stress and around someone with pneumonia

## 2014-11-29 NOTE — Discharge Instructions (Signed)
Return to the emergency room with worsening of symptoms, new symptoms or with symptoms that are concerning, especially thoughts of suicide or harming yourself or others. Do not drink, drive, operate machinery, take any other sedating meds while taking Ativan. Follow up with your therapist as scheduled next week. Read below information and follow recommendations.  Generalized Anxiety Disorder Generalized anxiety disorder (GAD) is a mental disorder. It interferes with life functions, including relationships, work, and school. GAD is different from normal anxiety, which everyone experiences at some point in their lives in response to specific life events and activities. Normal anxiety actually helps [place] prepare for and get through these life events and activities. Normal anxiety goes away after the event or activity is over.  GAD causes anxiety that is not necessarily related to specific events or activities. It also causes excess anxiety in proportion to specific events or activities. The anxiety associated with GAD is also difficult to control. GAD can vary from mild to severe. People with severe GAD can have intense waves of anxiety with physical symptoms (panic attacks).  SYMPTOMS The anxiety and worry associated with GAD are difficult to control. This anxiety and worry are related to many life events and activities and also occur more days than not for 6 months or longer. People with GAD also have three or more of the following symptoms (one or more in children):  Restlessness.   Fatigue.  Difficulty concentrating.   Irritability.  Muscle tension.  Difficulty sleeping or unsatisfying sleep. DIAGNOSIS GAD is diagnosed through an assessment by your health care provider. Your health care provider will ask you questions aboutyour mood,physical symptoms, and events in your life. Your health care provider may ask you about your medical history and use of alcohol or drugs, including  prescription medicines. Your health care provider may also do a physical exam and blood tests. Certain medical conditions and the use of certain substances can cause symptoms similar to those associated with GAD. Your health care provider may refer you to a mental health specialist for further evaluation. TREATMENT The following therapies are usually used to treat GAD:   Medication. Antidepressant medication usually is prescribed for long-term daily control. Antianxiety medicines may be added in severe cases, especially when panic attacks occur.   Talk therapy (psychotherapy). Certain types of talk therapy can be helpful in treating GAD by providing support, education, and guidance. A form of talk therapy called cognitive behavioral therapy can teach you healthy ways to think about and react to daily life events and activities.  Stress managementtechniques. These include yoga, meditation, and exercise and can be very helpful when they are practiced regularly. A mental health specialist can help determine which treatment is best for you. Some people see improvement with one therapy. However, other people require a combination of therapies. Document Released: 09/20/2012 Document Revised: 10/10/2013 Document Reviewed: 09/20/2012 [facility] Patient Information 2015 [place], [place]. This information is not intended to replace advice given to you by your health care provider. Make sure you discuss any questions you have with your health care provider.    Emergency Department Resource Guide 1) Find a Doctor and Pay Out of Pocket Although you won't have to find out who is covered by your insurance plan, it is a good idea to ask around and get recommendations. You will then need to call the office and see if the doctor you have chosen will accept you as a new patient and what types of options they offer for patients who  are self-pay. Some doctors offer discounts or will set up payment plans for their  patients who do not have insurance, but you will need to ask so you aren't surprised when you get to your appointment.  2) Contact Your Local Health Department Not all health departments have doctors that can see patients for sick visits, but many do, so it is worth a call to see if yours does. If you don't know where your local health department is, you can check in your phone book. The CDC also has a tool to help you locate your state's health department, and many state websites also have listings of all of their local health departments.  3) Find a Walk-in Clinic If your illness is not likely to be very severe or complicated, you may want to try a walk in clinic. These are popping up all over the country in pharmacies, drugstores, and shopping centers. They're usually staffed by nurse practitioners or physician assistants that have been trained to treat common illnesses and complaints. They're usually fairly quick and inexpensive. However, if you have serious medical issues or chronic medical problems, these are probably not your best option.  No Primary Care Doctor: - Call Health Connect at  [phone] - they can help you locate a primary care doctor that  accepts your insurance, provides certain services, etc. - Physician Referral Service- [phone]  Chronic Pain Problems: Organization         Address  Phone   Notes  [facility]  [phone] Patients need to be referred by their primary care doctor.   Medication Assistance: Organization         Address  Phone   Notes  [facility] Medication [facility] [address] [place] [place], [place] [address] [phone] --Must be a resident of [facility] -- Must have NO insurance coverage whatsoever (no Medicaid/ Medicare, etc.) -- The pt. MUST have a primary care doctor that directs their care regularly and follows them in the community   [facility]  [phone]   [employer]  [phone]     Agencies that provide inexpensive medical care: Organization         Address  Phone   Notes  [facility]  [phone]   [facility]    [phone]   [facility] [address] [place], [place] [address] [phone]   [facility] [address] [place] [place] [phone]   Planned Parenthood    [phone]   [facility]    [phone]   [facility] and [facility]  [address], [place] Phone:  [phone], Fax:  [phone] Hours of Operation:  9 am - 6 pm, M-F.  Also accepts Medicaid/Medicare and self-pay.  [facility]  [address], [place] Phone: [phone], Fax: [phone]. Hours of Operation:  8:30 am - 5:30 pm, M-F.  Also accepts Medicaid and self-pay.  [facility] [place] [address], [place] Phone: [phone]   [facility] [address] Natasha Bence [place], [place] [phone], Ext. [phone] Mondays & Thursdays: 7-9 AM.  First 15 patients are seen on a first come, first serve basis.    Medicaid-accepting [facility] Providers:  Organization         Address  Phone   Notes  [facility] [address], [place] [phone] Also accepts self-pay patients.  [facility] [address] Laurell Josephs [place], [place]  [phone]   [facility] [address], [place] [phone]   [facility] [address] [place] [phone]   Renaye Rakers [address], [place]   [phone] Only accepts [place] Access [place] patients after they have their name applied to their card.   Self-Pay (no insurance) in [facility]:  Organization         Address  Phone   Notes  Sickle Cell Patients, [facility] Internal Medicine [address] [place], [place] [phone]    [facility] Urgent Care [address] [place], [place] [phone]   [facility]  [address], [place] [phone]   [facility]/Dr. Osei-Bonsu  [address], [place] or [address], [place] [phone] Phone number for both [place] and [place] locations is the same.  [facility] [address], [place] [phone]   [facility] [address] [place] or [address] [phone]   [facility] [address], [place] [phone], phone; [phone], fax Sees patients 1st and 3rd Saturday of every month.  Must not qualify for public or private insurance (i.e. Medicaid, Medicare, [place] Health Choice, Veterans' Benefits)  Household income should be no more than 200% of the poverty level The clinic cannot treat you if you are pregnant or think you are pregnant  Sexually transmitted diseases are not treated at the clinic.    Dental Care: Organization         Address  Phone  Notes  [facility] Department of [facility] [address] [place], [place] [phone] Accepts children up to age 76 who are enrolled in [place] or [place] Health Choice; pregnant women with a Medicaid card; and children who have applied for Medicaid or [place] Health Choice, but were declined, whose parents can pay a reduced fee at time of service.  [facility] Department of [facility]  [address], [place] [phone] Accepts children up to age 44 who are enrolled in [place] or [place] Health Choice; pregnant women with a Medicaid card; and children who have applied for Medicaid or  Health Choice, but were declined, whose parents can pay a reduced fee at time of service.  [facility]  [address] [place], [place] [phone] Patients are seen by  appointment only. Walk-ins are not accepted. [facility] will see patients 63 years of age and older. Monday - Tuesday (8am-5pm) Most Wednesdays (8:30-5pm) $30 per visit, cash only  [facility] Adult [facility] Access [facility]  [address], [facility] [phone] Patients are seen by appointment only. Walk-ins are not accepted. [facility] will see patients 81 years of age and older. One Wednesday Evening (Monthly: Volunteer Based).  $30 per visit, cash only  [employer]  [phone] for adults; Children under age 18, call Graduate Pediatric Dentistry at [phone]. Children aged 59-14, please call [phone] to request a pediatric application.  Dental services are provided in all areas of dental care including fillings, crowns and bridges, complete and partial dentures, implants, gum treatment, root canals,  and extractions. Preventive care is also provided. Treatment is provided to both adults and children. Patients are selected via a lottery and there is often a waiting list.   [facility] [address], [place]  [phone] [email or web address]   [facility] [address] [place], [place] [phone], Ext. [phone] Second and Fourth Thursday of each month, opens at 6:30 AM; Clinic ends at 9 AM.  Patients are seen on a first-come first-served basis, and a limited number are seen during each clinic.   [facility]  [address] Ether Griffins [place], [place] [phone]   Eligibility Requirements You must have lived in [place], [place], or [place] counties for at least the last three months.   You cannot be eligible for state or federal sponsored [place], including [employer], [place], or [employer].   You generally cannot be eligible for healthcare insurance through your employer.    How to apply: Eligibility screenings are held every Tuesday and Wednesday afternoon from 1:00 pm until 4:00 pm. You  do not need an appointment for the interview!  [facility] [address], [place], [place] [phone]   [facility] Health Department  [phone]   [facility] Health Department  [phone]   [facility] Health Department  [phone]    Behavioral Health Resources in the Community: Intensive Outpatient Programs Organization         Address  Phone  Notes  [facility] [address] [place], [place] [phone]   [facility] [address], [place], [place] [phone]   [facility] [address], [place], [place]  [phone]   [facility] [address]  [place], [place] [phone] or [phone]   Substance Abuse Resources Organization         Address  Phone  Notes  [facility]  [phone]   [facility]  [phone]   [facility]  [phone]   Floydene Flock  [phone]   Residential & Outpatient Substance Abuse Program  9193703918   Psychological Services Organization         Address  Phone  Notes  [facility] Behavioral Health  [phone]   [facility]  [phone]   [facility] Mental Health [address] [place] [phone] or [phone]    Mobile Crisis Teams Organization         Address  Phone  Notes  [facility], Mobile Crisis Care Unit  [phone]   [facility]  [address] [place], [place] [phone]   Doristine Locks [address] [place] [phone]    Self-Help/Support Groups Organization         Address  Phone             Notes  [facility] [place] - variety of support groups  336- I7437963 Call for more information  Narcotics Anonymous (NA), [facility] [address], [employer] [place]  2 meetings at this location   Statistician          Address  Phone  Notes  [facility] [address],    [place]  [phone]   [facility]  [address], [place] [address], [place], [place] [phone]   [facility] [address] [place], [place] [phone] Admissions: 8am-3pm M-F  [facility] [address]    °[place], [place] [phone]   °[facility] [address], [place], [place] [phone]   °[facility] [address]  °[place], [place] [phone]   °[facility] - Intensive Outpatient [address], [place], [place] [phone]   °[facility] ([facility] [address]  °[place], [place] [phone] or [phone]   °[facility] ([facility]) [address] [place], [place] [phone] Accepts Medicaid  °[facility] [address]  °[place] 1-800-659-3381 Substance Abuse/Addiction Treatment  ° °[place] Behavioral Health Resources °Organization         Address  Phone  Notes  °[facility]  (888) [phone]   °Julie Brannon, PhD [address] [place], [place]   (336) [phone] or (336) 951-0000   °[facility]   [address] °[place], [place] ([phone]) [phone]   °[facility] [address], [place], [place] ([phone]) [phone] Insurance/Medicaid/sponsorship through [facility]  °[facility] [address]                                    [place], [place] [phone]) [phone] Therapy/tele-psych/case  °[facility] [address]  ° [place],  ([phone]) [phone]    °Dr. Arfeen  ([phone]) [phone]   °[facility] [place]  [employer] [place] [facility] 1) [address],  °2) [address], [place] °3)  [address], [place] ([phone]) [phone] °(336) [phone] ° °(336) [phone]   °[place] Child Abuse Hotline (336) 342-1394 or (336) 342-3537 (After Hours)    ° ° ° °

## 2014-11-29 NOTE — ED Provider Notes (Signed)
CSN: [id number]     Arrival date & time 11/29/14  1824 History   First MD Initiated Contact with Patient 11/29/14 1916     Chief Complaint  Patient presents with  . Cough  . Fatigue  . Diarrhea     (Consider location/radiation/quality/duration/timing/severity/associated sxs/prior Treatment) HPI  [name] [name] is a 29 y.o. female presenting with stress and felling overwhelmed with associated fatigue, generalized weakness, nausea, diarrhea, chills, weight loss. Pt denies fevers, vomiting. No melanotic stool or hematochezia. Pt with dry cough. Pt with built up stress with home situation, father of her children with chronic illness. Pt reports decreased appetite. Pt has history of anxiety and depression and has tried medications in the past. No current medications. [facility] with appointment on July 1st. Pt denies SI, HI, hallucinations, alcohol use but endorses marijuana use.    Past Medical History  Diagnosis Date  . CIN III (cervical intraepithelial neoplasia grade III) with severe dysplasia     2009  . Genital HSV   . Scoliosis   . Morphea   . Marijuana use 07/21/2013    Positive initial urine drug screen this pregnancy    Past Surgical History  Procedure Laterality Date  . No past surgeries    . Tubal ligation N/A 09/01/2013    Procedure: POST PARTUM TUBAL LIGATION;  Surgeon: Willodean Rosenthal, MD;  Location: [facility];  Service: Gynecology;  Laterality: N/A;  with filsie clips   Family History  Problem Relation Age of Onset  . Diabetes Other   . Hypertension Other    History  Substance Use Topics  . Smoking status: Current Every Day Smoker -- 0.25 packs/day  . Smokeless tobacco: Never Used  . Alcohol Use: No   OB History    Gravida Para Term Preterm AB TAB SAB Ectopic Multiple Living   0 0 0 0 0 3      Obstetric Comments   Avoided epidural with second baby, as pt has had "chronic back problems" perceived as worse  after delivery with epidural     Review of Systems 10 Systems reviewed and are negative for acute change except as noted in the HPI.    Allergies  Review of patient's allergies indicates no known allergies.  Home Medications   Prior to Admission medications   Medication Sig Start Date End Date Taking? Authorizing Provider  ibuprofen (ADVIL,MOTRIN) 200 MG tablet Take 200 mg by mouth every 6 (six) hours as needed for headache (headache).   Yes Historical Provider, MD  Multiple Vitamin (STRESS B PO) Take 1 tablet by mouth daily.   Yes Historical Provider, MD  Multiple Vitamins-Minerals (MULTIVITAMIN & MINERAL PO) Take 1 tablet by mouth daily.   Yes Historical Provider, MD  ferrous sulfate 325 (65 FE) MG tablet Take 1 tablet (325 mg total) by mouth daily with breakfast. Patient not taking: Reported on 09/04/2014 09/02/13   Vale Haven, MD  HYDROcodone-acetaminophen (NORCO/VICODIN) 5-325 MG per tablet Take 1-2 tablets by mouth every 4 (four) hours as needed. Patient not taking: Reported on 11/29/2014 09/04/14   Elpidio Anis, PA-C  ibuprofen (ADVIL,MOTRIN) 800 MG tablet Take 1 tablet (800 mg total) by mouth 3 (three) times daily. Patient not taking: Reported on 11/29/2014 09/04/14   Elpidio Anis, PA-C  LORazepam (ATIVAN) 1 MG tablet Take 1 tablet (1 mg total) by mouth every 6 (six) hours as needed for anxiety. 11/29/14   Oswaldo Conroy, PA-C  oxyCODONE-acetaminophen (PERCOCET/ROXICET) 5-325 MG per tablet Take 1-2 tablets by mouth every 4 (four) hours as needed for severe pain (moderate - severe pain). Patient not taking: Reported on 09/04/2014 09/02/13   Sandrea Matte, MD  Prenatal Vit-Fe Fumarate-FA (PRENATAL VITAMINS PLUS) 27-1 MG TABS Take 1 tablet by mouth daily. Patient not taking: Reported on 09/04/2014 08/23/13   Vale Haven, MD   BP 107/59 mmHg  Pulse 81  Temp(Src) 98.3 F (36.8 C) (Oral)  Resp 18  SpO2 97%  LMP 11/15/2014 Physical Exam  Constitutional: She appears well-developed and  well-nourished. No distress.  HENT:  Head: Normocephalic and atraumatic.  Mouth/Throat: Oropharynx is clear and moist.  Eyes: Conjunctivae and EOM are normal. Pupils are equal, round, and reactive to light. Right eye exhibits no discharge. Left eye exhibits no discharge.  Neck: Normal range of motion. Neck supple.  No nuchal rigidity  Cardiovascular: Normal rate and regular rhythm.   Pulmonary/Chest: Effort normal and breath sounds normal. No respiratory distress. She has no wheezes.  Abdominal: Soft. Bowel sounds are normal. She exhibits no distension. There is no tenderness.  Neurological: She is alert. No cranial nerve deficit. Coordination normal.  Speech is clear and goal oriented. Peripheral visual fields intact. Strength 5/5 in upper and lower extremities. Sensation intact. Intact rapid alternating movements, finger to nose, and heel to shin. Negative Romberg. No pronator drift. Normal gait.   Skin: Skin is warm and dry. She is not diaphoretic.  Nursing note and vitals reviewed.   ED Course  Procedures (including critical care time) Labs Review Labs Reviewed  BASIC METABOLIC PANEL - Abnormal; Notable for the following:    Glucose, Bld 155 (*)    All other components within normal limits  CBG MONITORING, ED - Abnormal; Notable for the following:    Glucose-Capillary 151 (*)    All other components within normal limits  CBC  POC URINE PREG, ED    Imaging Review Dg Chest 2 View (if Patient Has Fever And/or Copd)  11/29/2014   CLINICAL DATA:  Cough and recent weight loss  EXAM: CHEST  2 VIEW  COMPARISON:  None.  FINDINGS: There are apparent nipple shadows bilaterally. Lungs are clear. Heart size and pulmonary vascularity are normal. No adenopathy. No bone lesions.  IMPRESSION: No edema or consolidation.   Electronically Signed   By: Bretta Bang III M.D.   On: 11/29/2014 19:57     EKG Interpretation   Date/Time:  Wednesday November 29 2014 19:41:32 EDT Ventricular Rate:   73 PR Interval:  129 QRS Duration: 95 QT Interval:  381 QTC Calculation: 420 R Axis:   80 Text Interpretation:  Sinus rhythm RSR' in V1 or V2, probably normal  variant Borderline T wave abnormalities Baseline wander in lead(s) I III  aVL No old tracing to compare Confirmed by GOLDSTON  MD, SCOTT ([id number]) on  11/29/2014 8:30:07 PM      MDM   Final diagnoses:  Anxiety  Cough   Pt presenting with anxiety as well as multiple complaints. VSS. No neurological deficits. No respiratory distress. Pt tearful during exam when talking about stress. Pt states she is overwhelmed with the stress. Denies SI, HI, hallucinations, self harm or alcohol use. Pt given ativan in ED with improvement of her symptoms. Lab work up CXR, EKG WNL. Pt symptoms likely related to her anxiety. Pt discharged with ativan. Driving and sedation precautions provided. Pt to follow up with her therapist in one week. Pt given ED resources for behavioral  health as well. Patient is afebrile, nontoxic, and in no acute distress. Patient is appropriate for outpatient management and is stable for discharge.  Discussed return precautions with patient. Discussed all results and patient verbalizes understanding and agrees with plan.    Oswaldo Conroy, PA-C 11/29/14 2136  Pricilla Loveless, MD 12/05/14 [phone]

## 2014-11-29 NOTE — ED Notes (Signed)
ED EKG and CBG completed by Warden Fillers, EMT

## 2014-11-29 NOTE — ED Notes (Signed)
Pt c/o diarrhea, weakness, absent-mindedness, chills, hot flashes, and fatigue since Monday 6/20 morning.  She states that she feels "almost dead".  Denies n/v/fever. Two episodes of diarrhea today.

## 2015-04-12 ENCOUNTER — Encounter (HOSPITAL_COMMUNITY): Payer: Self-pay | Admitting: *Deleted

## 2015-04-12 ENCOUNTER — Emergency Department (HOSPITAL_COMMUNITY)
Admission: EM | Admit: 2015-04-12 | Discharge: 2015-04-12 | Disposition: A | Discharge status: Home / Self Care | Payer: Medicaid Other | Attending: Emergency Medicine | Admitting: Emergency Medicine

## 2015-04-12 ENCOUNTER — Emergency Department (HOSPITAL_COMMUNITY): Payer: Medicaid Other

## 2015-04-12 DIAGNOSIS — Z720 Tobacco use: Secondary | ICD-10-CM | POA: Insufficient documentation

## 2015-04-12 DIAGNOSIS — M2012 Hallux valgus (acquired), left foot: Secondary | ICD-10-CM

## 2015-04-12 DIAGNOSIS — M21611 Bunion of right foot: Secondary | ICD-10-CM

## 2015-04-12 DIAGNOSIS — Z79899 Other long term (current) drug therapy: Secondary | ICD-10-CM | POA: Diagnosis not present

## 2015-04-12 DIAGNOSIS — M2011 Hallux valgus (acquired), right foot: Secondary | ICD-10-CM | POA: Diagnosis not present

## 2015-04-12 DIAGNOSIS — Z8619 Personal history of other infectious and parasitic diseases: Secondary | ICD-10-CM | POA: Insufficient documentation

## 2015-04-12 DIAGNOSIS — M79671 Pain in right foot: Secondary | ICD-10-CM | POA: Diagnosis present

## 2015-04-12 MED ORDER — IBUPROFEN 800 MG PO TABS: 800.0000 mg | ORAL_TABLET | Freq: Three times a day (TID) | ORAL | Status: DC

## 2015-04-12 MED ORDER — IBUPROFEN 400 MG PO TABS
800.0000 mg | ORAL_TABLET | Freq: Once | ORAL | Status: AC
  Administered 2015-04-12: 800 mg via ORAL
  Filled 2015-04-12: qty 2

## 2015-04-12 NOTE — ED Notes (Signed)
Declined W/C at D/C and was escorted to lobby by RN. 

## 2015-04-12 NOTE — ED Notes (Signed)
Pt reports pain and swelling to RT foot for one year and pt reports a growth to bottom LT foot that she cuts off and it grows back.

## 2015-04-12 NOTE — Discharge Instructions (Signed)
Take your medications as prescribed. You may also use ice for pain relief. Follow-up with your primary care or the podiatrist's office that is listed above. Return to the emergency department if symptoms worsen or new onset of fever, swelling, numbness, tingling, weakness.

## 2015-04-12 NOTE — ED Provider Notes (Signed)
CSN: [id number]     Arrival date & time 04/12/15  1035 History  By signing my name below, I, Emmanuella Mensah, attest that this documentation has been prepared under the direction and in the presence of Melburn Hake, PA-C. Electronically Signed: Angelene Giovanni, ED Scribe. 04/12/2015. 1:14 PM.     Chief Complaint  Patient presents with  . Foot Pain   The history is provided by the patient. No language interpreter was used.   HPI Comments: [name] [name] is a 29 y.o. female with a hx of scoliosis who presents to the Emergency Department complaining of gradually worsening foot pain onset several months ago s/p foot injury that occurred a year ago when her son's head fell on it. Denies numbness, tingling, weakness. Pt reports right foot pain is worse with movement/walking. She also complains of re-occurring painful "internal bumps" on her left foot that she keeps cuting off. She denies any drainage from the area or any discoloration adding that when she cuts it off it looks like "dead skin". She denies stepping on anything prior to her symptoms. No alleviating factors noted.   Past Medical History  Diagnosis Date  . CIN III (cervical intraepithelial neoplasia grade III) with severe dysplasia     2009  . Genital HSV   . Scoliosis   . Morphea   . Marijuana use 07/21/2013    Positive initial urine drug screen this pregnancy    Past Surgical History  Procedure Laterality Date  . No past surgeries    . Tubal ligation N/A 09/01/2013    Procedure: POST PARTUM TUBAL LIGATION;  Surgeon: Willodean Rosenthal, MD;  Location: [facility];  Service: Gynecology;  Laterality: N/A;  with filsie clips   Family History  Problem Relation Age of Onset  . Diabetes Other   . Hypertension Other    Social History  Substance Use Topics  . Smoking status: Current Every Day Smoker -- 0.25 packs/day  . Smokeless tobacco: Never Used  . Alcohol Use: No   OB History    Gravida Para Term Preterm AB TAB SAB  Ectopic Multiple Living   0 0 0 0 0 3      Obstetric Comments   Avoided epidural with second baby, as pt has had "chronic back problems" perceived as worse after delivery with epidural     Review of Systems  Musculoskeletal: Positive for arthralgias.      Allergies  Review of patient's allergies indicates no known allergies.  Home Medications   Prior to Admission medications   Medication Sig Start Date End Date Taking? Authorizing Provider  ferrous sulfate 325 (65 FE) MG tablet Take 1 tablet (325 mg total) by mouth daily with breakfast. Patient not taking: Reported on 09/04/2014 09/02/13   Vale Haven, MD  HYDROcodone-acetaminophen (NORCO/VICODIN) 5-325 MG per tablet Take 1-2 tablets by mouth every 4 (four) hours as needed. Patient not taking: Reported on 11/29/2014 09/04/14   Elpidio Anis, PA-C  ibuprofen (ADVIL,MOTRIN) 200 MG tablet Take 200 mg by mouth every 6 (six) hours as needed for headache (headache).    Historical Provider, MD  ibuprofen (ADVIL,MOTRIN) 800 MG tablet Take 1 tablet (800 mg total) by mouth 3 (three) times daily. Patient not taking: Reported on 11/29/2014 09/04/14   Elpidio Anis, PA-C  LORazepam (ATIVAN) 1 MG tablet Take 1 tablet (1 mg total) by mouth every 6 (six) hours as needed for anxiety. 11/29/14   Oswaldo Conroy, PA-C  Multiple Vitamin (STRESS B  PO) Take 1 tablet by mouth daily.    Historical Provider, MD  Multiple Vitamins-Minerals (MULTIVITAMIN & MINERAL PO) Take 1 tablet by mouth daily.    Historical Provider, MD  oxyCODONE-acetaminophen (PERCOCET/ROXICET) 5-325 MG per tablet Take 1-2 tablets by mouth every 4 (four) hours as needed for severe pain (moderate - severe pain). Patient not taking: Reported on 09/04/2014 09/02/13   Sandrea Matte, MD  Prenatal Vit-Fe Fumarate-FA (PRENATAL VITAMINS PLUS) 27-1 MG TABS Take 1 tablet by mouth daily. Patient not taking: Reported on 09/04/2014 08/23/13   Vale Haven, MD   BP 113/71 mmHg  Pulse 91  Temp(Src) 98  F (36.7 C) (Oral)  Resp 16  SpO2 97%  LMP 04/12/2015  Breastfeeding? No Physical Exam  Constitutional: She is oriented to person, place, and time. She appears well-developed and well-nourished. No distress.  HENT:  Head: Normocephalic and atraumatic.  Eyes: Conjunctivae and EOM are normal.  Neck: Neck supple. No tracheal deviation present.  Cardiovascular: Normal rate.   Pulmonary/Chest: Effort normal. No respiratory distress.  Musculoskeletal: Normal range of motion.       Right foot: There is tenderness (MTP joint). There is normal range of motion, no bony tenderness, no swelling, normal capillary refill, no crepitus, no deformity and no laceration.  Pt able to stand and ambulate in room Full ROM of bilateral feet and ankles  2+ DP pulses  No swelling, ecchymosis, abrasion, or any other evidence of trauma or injury.  Plantar aspect of left heel reveals small lesion consistent with callus.  Bilateral hallux valgus   Neurological: She is alert and oriented to person, place, and time. She has normal strength and normal reflexes. No sensory deficit. Coordination and gait normal.  Skin: Skin is warm and dry.  Psychiatric: She has a normal mood and affect. Her behavior is normal.  Nursing note and vitals reviewed.   ED Course  Procedures (including critical care time) DIAGNOSTIC STUDIES: Oxygen Saturation is 97% on RA, adequate by my interpretation.    COORDINATION OF CARE: 12:44 PM- Pt advised of plan for treatment and pt agrees. Will prescribe Ibuprofen. Recommended to use ice on foot and to rest her foot as much as possible. Will provide contact for follow up with Podiatrist for long term care and treatment.    Imaging Review Dg Foot Complete Right  04/12/2015  CLINICAL DATA:  Persistent pain.  Trauma approximately 1 year prior EXAM: RIGHT FOOT COMPLETE - 3+ VIEW COMPARISON:  None. FINDINGS: Frontal, oblique, and lateral views were obtained. There is no fracture or dislocation.  Joint spaces appear intact. No erosive change. There is bony overgrowth along the distal first metatarsal with hallux valgus deformity at the first MTP joint and early bunion formation. IMPRESSION: Hallux valgus deformity at the first MTP joint with bony overgrowth of the medial distal first metatarsal and early bunion formation. No appreciable joint space narrowing. No fracture or dislocation. Electronically Signed   By: Bretta Bang III M.D.   On: 04/12/2015 11:31   Melburn Hake, PA-C has personally reviewed and evaluated these images as part of her medical decision-making.  Filed Vitals:   04/12/15 1318  BP: 104/57  Pulse: 67  Temp: 97.8 F (36.6 C)  Resp: 16     MDM   Final diagnoses:  Hallux valgus, acquired, bilateral  Bunion of great toe of right foot    Pt presents with right foot pain and skin lesion to left heel for the past year. Reports her son's head  fell on her right foot 1 year ago. VSS. Exam reveals bilateral hallux valgus, callus noted to left heel, feet neurovascularly intact. Right foot xray reveals hallux valgus at 1st MCP with bony overgrowth and early bunion formation, no fx of dislocation. Plan to d/c pt home with NSAIDs. Pt given podiatry follow up regarding her bunion.   Evaluation does not show pathology requring ongoing emergent intervention or admission. Pt is hemodynamically stable and mentating appropriately. Discussed findings/results and plan with patient/guardian, who agrees with plan. All questions answered. Return precautions discussed and outpatient follow up given.    Evaluation does not show pathology requring ongoing emergent intervention or admission. Pt is hemodynamically stable and mentating appropriately. Discussed findings/results and plan with patient/guardian, who agrees with plan. All questions answered. Return precautions discussed and outpatient follow up given.    Satira Sarkicole Elizabeth FranklinNadeau, [place] JerseyPA-C 04/12/15 1941  Glynn OctaveStephen Rancour,  MD 04/13/15 1500

## 2016-11-22 IMAGING — CR DG CHEST 2V
2 series · 2 of 2 positions shown · non-contrast
Comparison: None.

CLINICAL DATA: Cough and recent weight loss

EXAM:
CHEST  2 VIEW

[w chest pa]
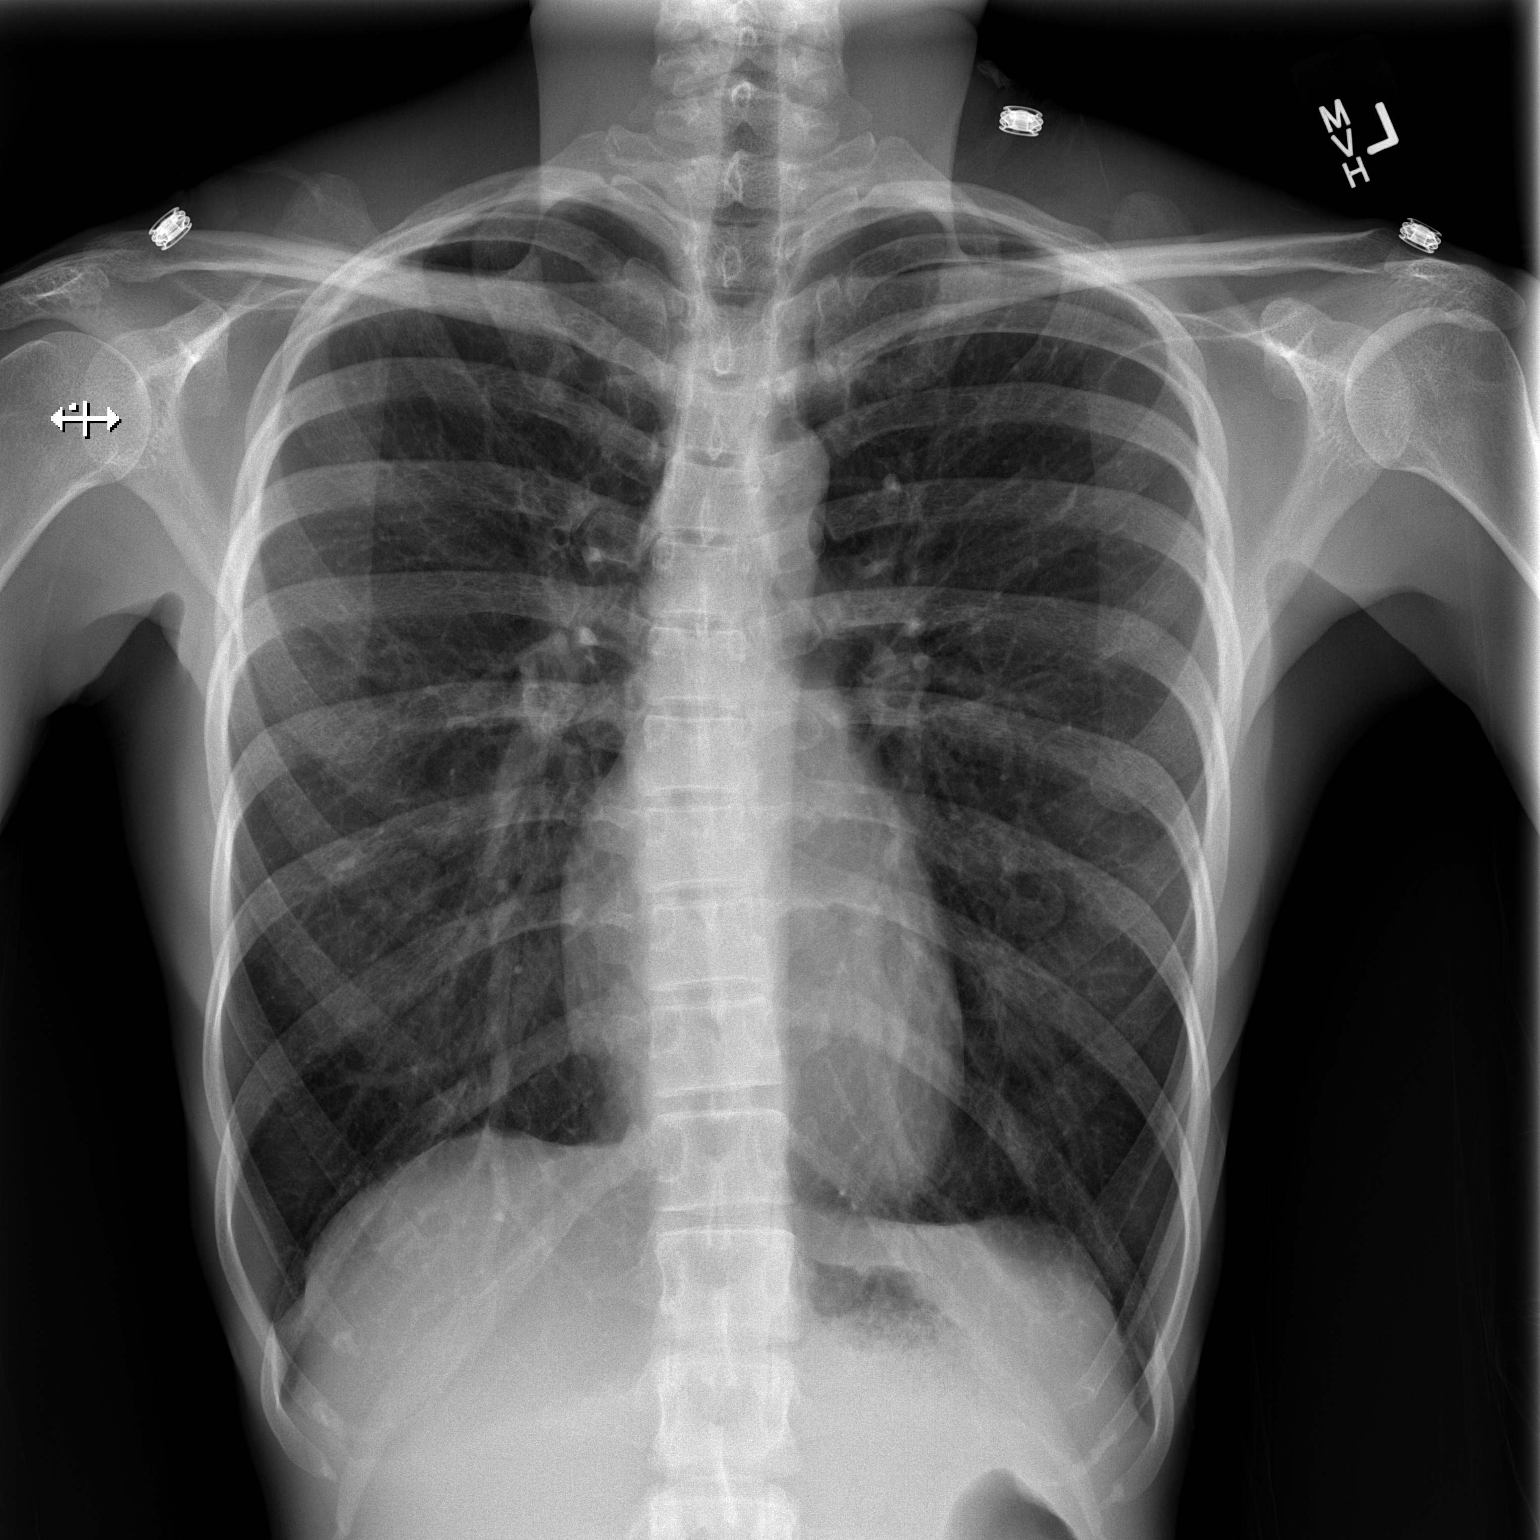

[w chest lat]
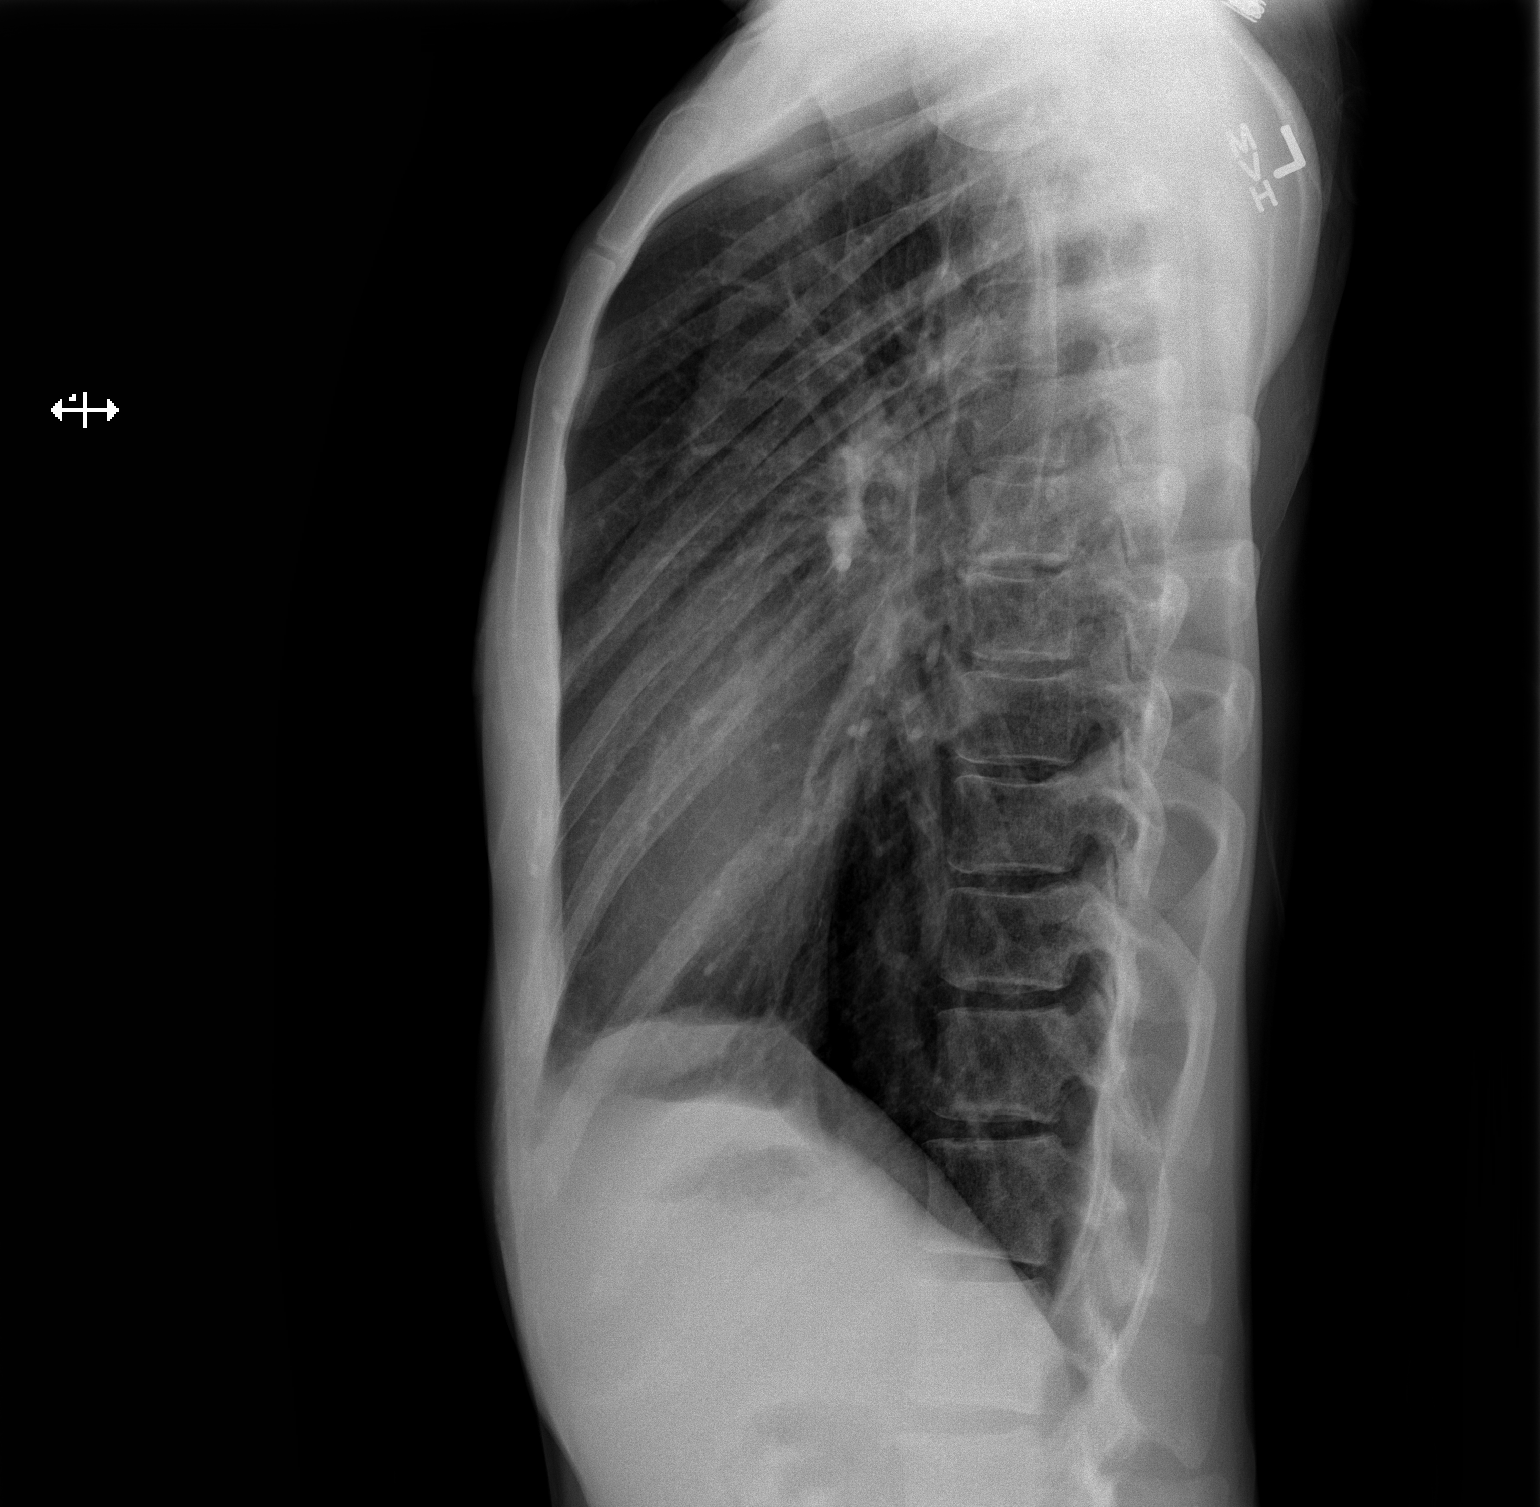

[2 of 2 positions shown; findings below may reference images not displayed]

FINDINGS: There are apparent nipple shadows bilaterally. Lungs are clear.
Heart size and pulmonary vascularity are normal. No adenopathy. No
bone lesions.
IMPRESSION: No edema or consolidation.

## 2017-02-12 ENCOUNTER — Telehealth: Payer: Self-pay | Admitting: *Deleted

## 2017-02-12 ENCOUNTER — Encounter: Payer: Self-pay | Admitting: Obstetrics and Gynecology

## 2017-02-12 ENCOUNTER — Ambulatory Visit: Payer: Self-pay | Admitting: Obstetrics and Gynecology

## 2017-02-12 NOTE — Telephone Encounter (Signed)
Pt no showed for colposcopy today. Dr. Vergie LivingPickens reviewed her chart and saw that patient does not need a colposcopy but just a repeat pap smear. Patient may come to our office for an annual exam if she would like to. I called patient and left message to call us back regarding her missed appt.

## 2017-02-12 NOTE — Progress Notes (Signed)
Patient did not keep GYN referral appointment for 02/12/2017. Chart reviewed and it looks like she had a 02/2016 ASCUS/HPV pos and ? A colpo 04/2016 with [facility]. I can't tell the results of what they saw but I don't see any path reports. Based on this, I'd recommend repeat pap and hpv testing. Clinic RN to let referral practice know about no show and recommendations.   Cornelia Copaharlie Delmo Matty, Jr MD Attending Center [facility](Faculty Practice)

## 2017-02-23 NOTE — Telephone Encounter (Signed)
Attempted to contact pt phone kept ringing with no VM.  Letter sent.  Pt is already rescheduled for 03/13/17 will be able to offer pap smear that visit per Dr. Vergie Living recommendations. Note placed in appt notes as well.

## 2017-03-13 ENCOUNTER — Encounter: Payer: Self-pay | Admitting: Medical

## 2017-03-13 ENCOUNTER — Ambulatory Visit (INDEPENDENT_AMBULATORY_CARE_PROVIDER_SITE_OTHER): Payer: Medicaid Other | Admitting: Medical

## 2017-03-13 ENCOUNTER — Other Ambulatory Visit (HOSPITAL_COMMUNITY)
Admission: RE | Admit: 2017-03-13 | Discharge: 2017-03-13 | Disposition: A | Discharge status: Home / Self Care | Payer: Medicaid Other | Source: Ambulatory Visit | Attending: Medical | Admitting: Medical

## 2017-03-13 VITALS — BP 111/64 | HR 76 | Ht 69.0 in | Wt 105.5 lb

## 2017-03-13 DIAGNOSIS — R87810 Cervical high risk human papillomavirus (HPV) DNA test positive: Secondary | ICD-10-CM

## 2017-03-13 DIAGNOSIS — Z124 Encounter for screening for malignant neoplasm of cervix: Secondary | ICD-10-CM | POA: Diagnosis not present

## 2017-03-13 DIAGNOSIS — Z1151 Encounter for screening for human papillomavirus (HPV): Secondary | ICD-10-CM | POA: Diagnosis not present

## 2017-03-13 DIAGNOSIS — R87610 Atypical squamous cells of undetermined significance on cytologic smear of cervix (ASC-US): Secondary | ICD-10-CM | POA: Insufficient documentation

## 2017-03-13 DIAGNOSIS — Z3202 Encounter for pregnancy test, result negative: Secondary | ICD-10-CM | POA: Diagnosis not present

## 2017-03-13 LAB — POCT PREGNANCY, URINE: Preg Test, Ur: NEGATIVE

## 2017-03-13 NOTE — Progress Notes (Signed)
phq9 and gad 7 elevated, declined to see behavioral health clinician . Provider aware

## 2017-03-13 NOTE — Patient Instructions (Signed)
Colposcopy, Care After This sheet gives you information about how to care for yourself after your procedure. Your doctor may also give you more specific instructions. If you have problems or questions, contact your doctor. What can I expect after the procedure? If you did not have a tissue sample removed (did not have a biopsy), you may only have some spotting for a few days. You can go back to your normal activities. If you had a tissue sample removed, it is common to have:  Soreness and pain. This may last for a few days.  Light-headedness.  Mild bleeding from your vagina or dark-colored, grainy discharge from your vagina. This may last for a few days. You may need to wear a sanitary pad.  Spotting for at least 48 hours after the procedure.  Follow these instructions at home:  Take over-the-counter and prescription medicines only as told by your doctor. Ask your doctor what medicines you can start taking again. This is very important if you take blood-thinning medicine.  Do not drive or use heavy machinery while taking prescription pain medicine.  For 7-14 days, or as long as your doctor tells you, avoid: ? Douching. ? Using tampons. ? Having sex.  If you use birth control (contraception), keep using it.  Limit activity for the first day after the procedure. Ask your doctor what activities are safe for you.  It is up to you to get the results of your procedure. Ask your doctor when your results will be ready.  Keep all follow-up visits as told by your doctor. This is important. Contact a doctor if:  You get a skin rash. Get help right away if:  You are bleeding a lot from your vagina. It is a lot of bleeding if you are using more than one pad an hour for 2 hours in a row.  You have clumps of blood (blood clots) coming from your vagina.  You have a fever.  You have chills  You have pain in your lower belly (pelvic area).  You have signs of infection, such as vaginal  discharge that is: ? Different than usual. ? Yellow. ? Bad-smelling.  You have very pain or cramps in your lower belly that do not get better with medicine.  You feel light-headed.  You feel dizzy.  You pass out (faint). Summary  If you did not have a tissue sample removed (did not have a biopsy), you may only have some spotting for a few days. You can go back to your normal activities.  If you had a tissue sample removed, it is common to have mild pain and spotting for 48 hours.  For 3 days, or as long as your doctor tells you, avoid douching, using tampons and having sex.  Get help right away if you have bleeding, very bad pain, or signs of infection. This information is not intended to replace advice given to you by your health care provider. Make sure you discuss any questions you have with your health care provider. Document Released: 11/12/2007 Document Revised: 02/13/2016 Document Reviewed: 02/13/2016 Elsevier Interactive Patient Education  2018 [employer].

## 2017-03-13 NOTE — Progress Notes (Signed)
    GYNECOLOGY CLINIC COLPOSCOPY PROCEDURE NOTE  [name] is a 31 y.o. [phone] here for colposcopy for ASCUS with POSITIVE high risk HPV pap smear on 02/2016. Discussed role for HPV in cervical dysplasia, need for surveillance.  Patient given informed consent, signed copy in the chart, time out was performed.  Placed in lithotomy position. Cervix viewed with speculum and colposcope after application of acetic acid.   Colposcopy adequate? No  acetowhite lesion(s) noted at 3 o'clock; biopsies obtained.  ECC specimen obtained. All specimens were labelled and sent to pathology.   Patient was given post procedure instructions.  Will follow up pathology and manage accordingly.  Routine preventative health maintenance measures emphasized. Pap smear also done today since last pap was 02/2016. Colpo still performed given poor compliance and history of severe dysplasia in 2009 requiring LEEP  Kathlene Cote 03/13/2017 11:38 AM

## 2017-03-17 ENCOUNTER — Other Ambulatory Visit: Payer: Self-pay | Admitting: Medical

## 2017-03-17 DIAGNOSIS — B373 Candidiasis of vulva and vagina: Secondary | ICD-10-CM

## 2017-03-17 DIAGNOSIS — B3731 Acute candidiasis of vulva and vagina: Secondary | ICD-10-CM

## 2017-03-17 LAB — CYTOLOGY - PAP
DIAGNOSIS: NEGATIVE
HPV (WINDOPATH): DETECTED — AB

## 2017-04-05 IMAGING — DX DG FOOT COMPLETE 3+V*R*
3 series · 3 of 3 positions shown · non-contrast
Comparison: None.

CLINICAL DATA: Persistent pain.  Trauma approximately 1 year prior

EXAM:
RIGHT FOOT COMPLETE - 3+ VIEW

[foot ap]
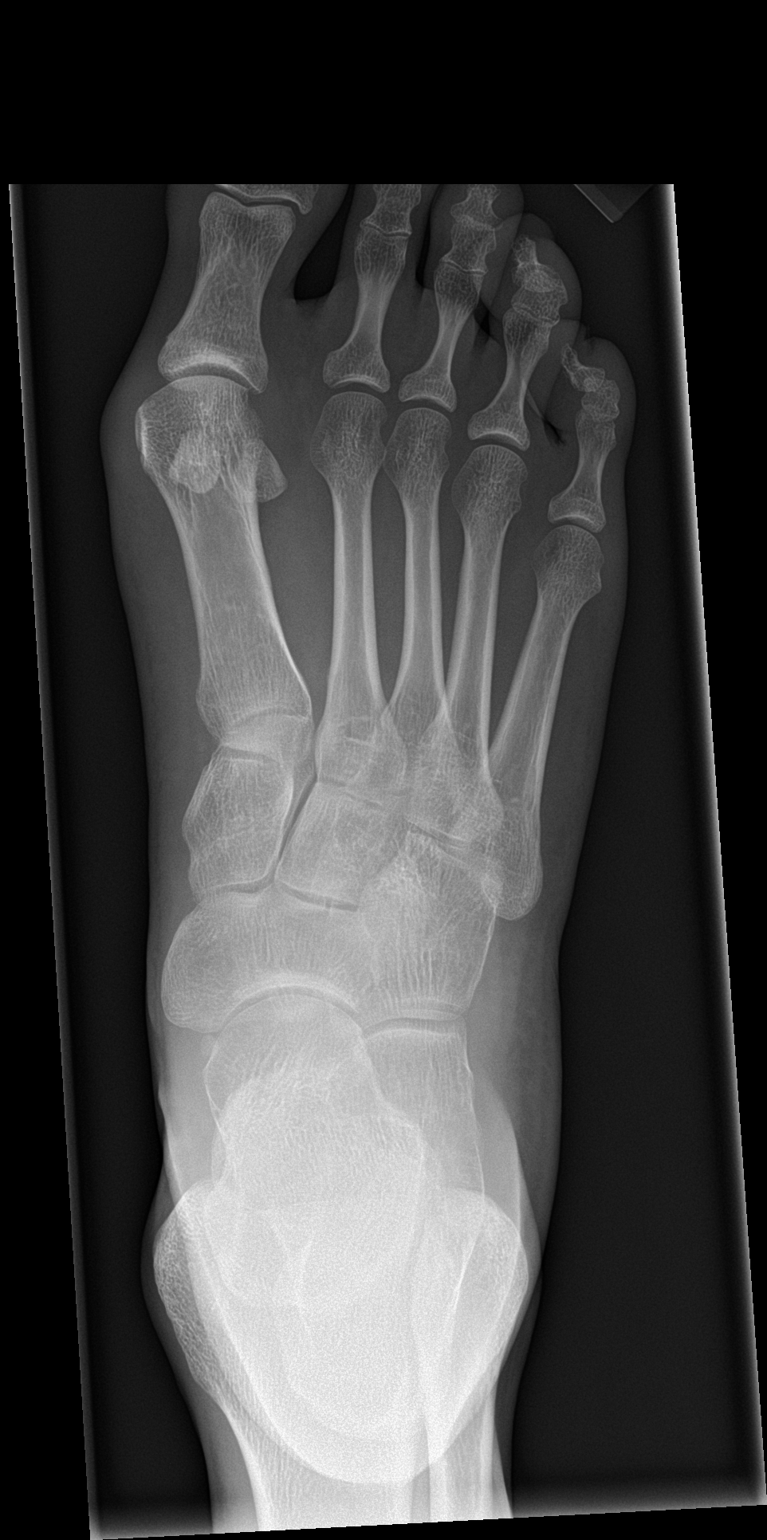

[foot obl]
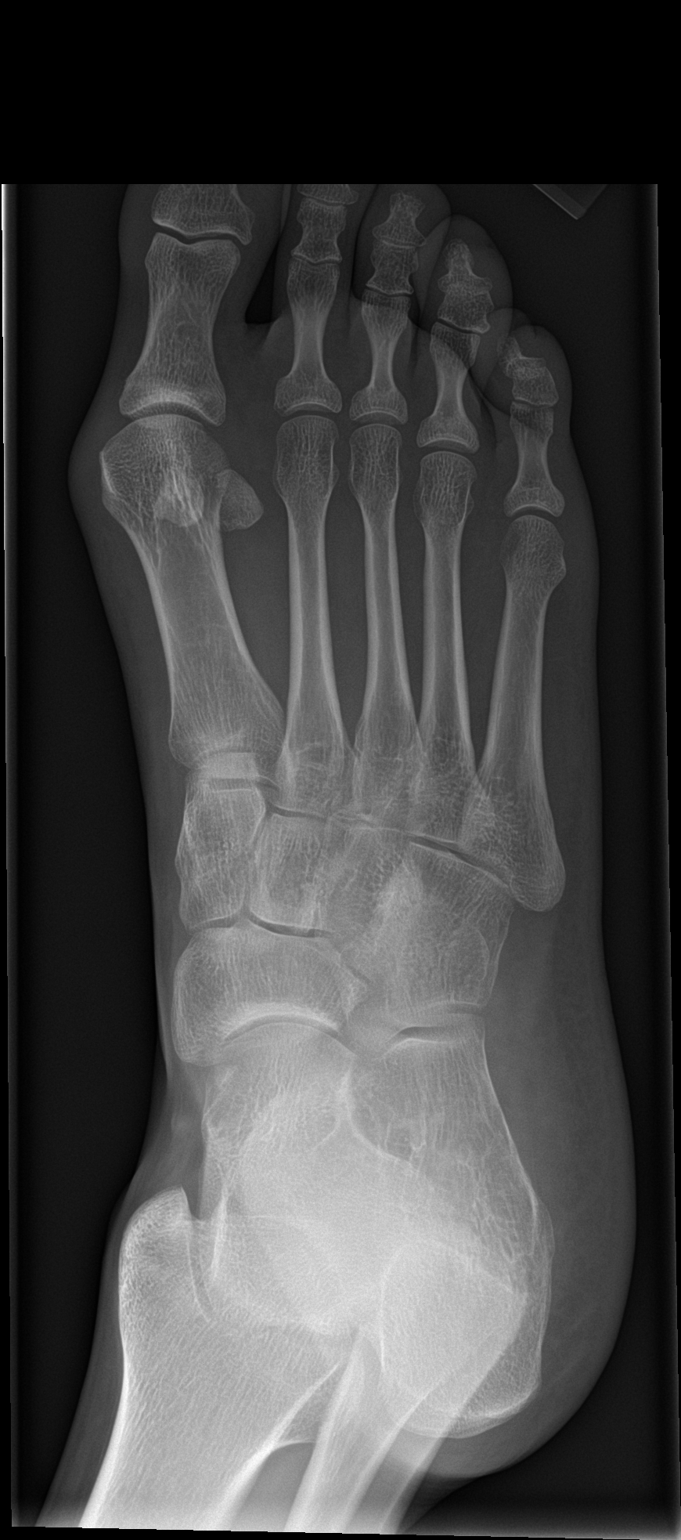

[foot lat]
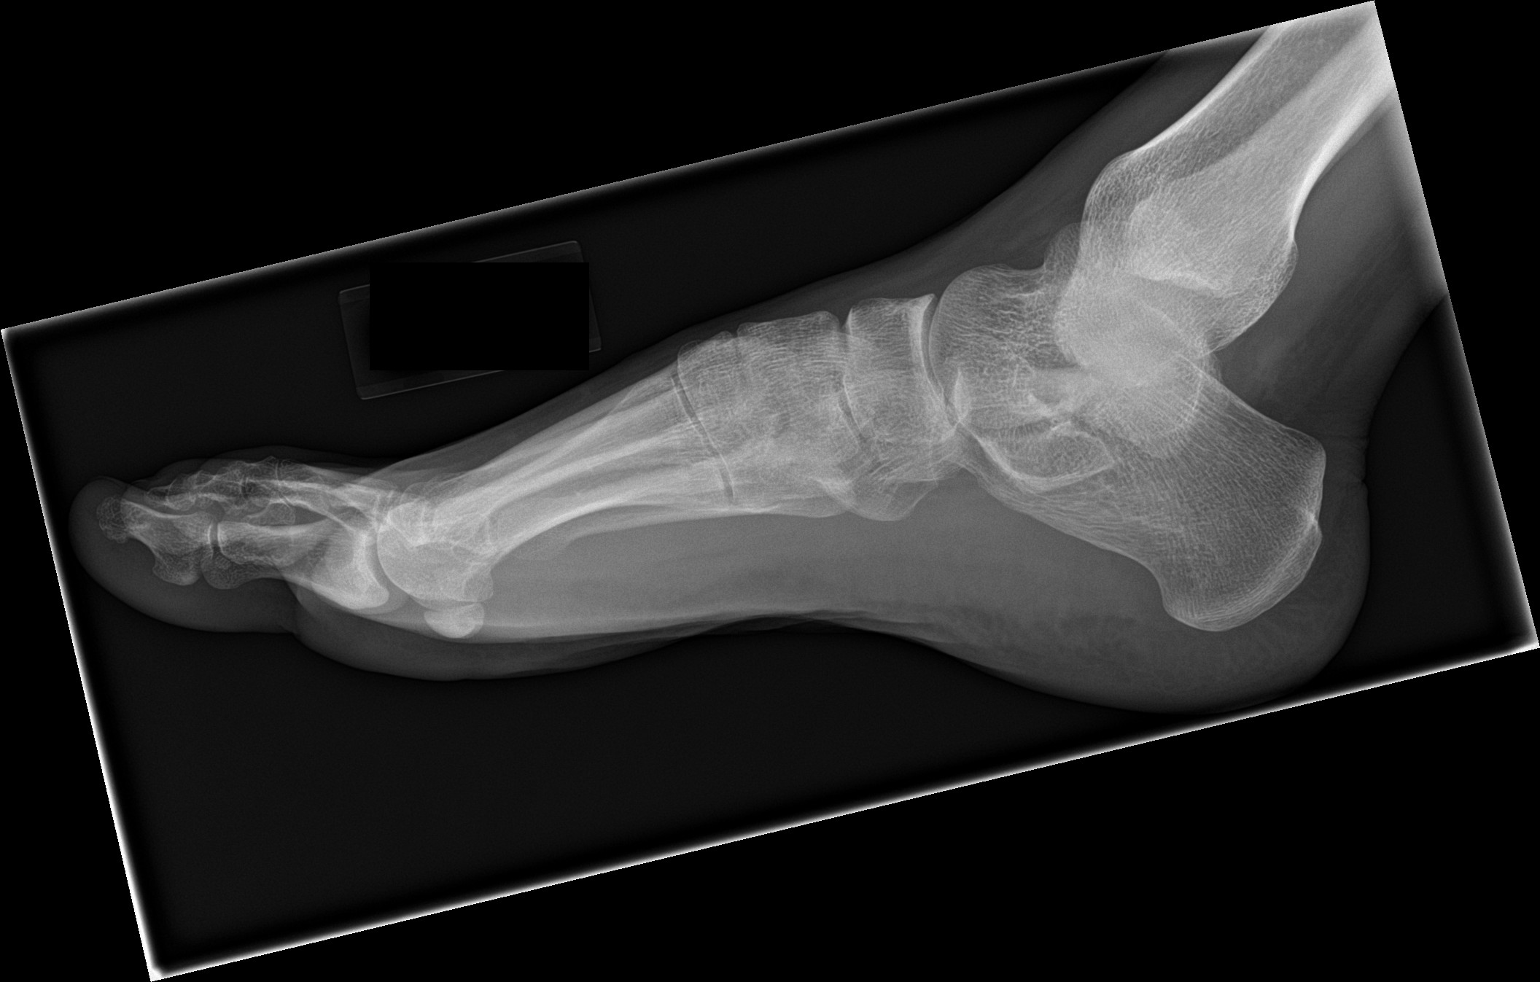

[3 of 3 positions shown; findings below may reference images not displayed]

FINDINGS: Frontal, oblique, and lateral views were obtained. There is no
fracture or dislocation. Joint spaces appear intact. No erosive
change. There is bony overgrowth along the distal first metatarsal
with hallux valgus deformity at the first MTP joint and early bunion
formation.
IMPRESSION: Hallux valgus deformity at the first MTP joint with bony overgrowth
of the medial distal first metatarsal and early bunion formation. No
appreciable joint space narrowing. No fracture or dislocation.

## 2018-06-08 ENCOUNTER — Telehealth (HOSPITAL_COMMUNITY): Payer: Self-pay | Admitting: Emergency Medicine

## 2018-06-08 ENCOUNTER — Ambulatory Visit (HOSPITAL_COMMUNITY)
Admission: EM | Admit: 2018-06-08 | Discharge: 2018-06-08 | Disposition: A | Discharge status: Home / Self Care | Payer: Medicaid Other | Attending: Family Medicine | Admitting: Family Medicine

## 2018-06-08 ENCOUNTER — Encounter (HOSPITAL_COMMUNITY): Payer: Self-pay

## 2018-06-08 DIAGNOSIS — Z760 Encounter for issue of repeat prescription: Secondary | ICD-10-CM | POA: Diagnosis not present

## 2018-06-08 DIAGNOSIS — F17210 Nicotine dependence, cigarettes, uncomplicated: Secondary | ICD-10-CM | POA: Diagnosis not present

## 2018-06-08 DIAGNOSIS — M419 Scoliosis, unspecified: Secondary | ICD-10-CM | POA: Diagnosis not present

## 2018-06-08 DIAGNOSIS — R42 Dizziness and giddiness: Secondary | ICD-10-CM

## 2018-06-08 DIAGNOSIS — Z833 Family history of diabetes mellitus: Secondary | ICD-10-CM | POA: Diagnosis not present

## 2018-06-08 DIAGNOSIS — N939 Abnormal uterine and vaginal bleeding, unspecified: Secondary | ICD-10-CM | POA: Insufficient documentation

## 2018-06-08 DIAGNOSIS — R110 Nausea: Secondary | ICD-10-CM | POA: Diagnosis not present

## 2018-06-08 DIAGNOSIS — Z79899 Other long term (current) drug therapy: Secondary | ICD-10-CM | POA: Diagnosis not present

## 2018-06-08 LAB — POCT I-STAT, CHEM 8
BUN: 8 mg/dL (ref 6–20)
CREATININE: 0.6 mg/dL (ref 0.44–1.00)
Calcium, Ion: 1.15 mmol/L (ref 1.15–1.40)
Chloride: 106 mmol/L (ref 98–111)
GLUCOSE: 85 mg/dL (ref 70–99)
HCT: 42 % (ref 36.0–46.0)
HEMOGLOBIN: 14.3 g/dL (ref 12.0–15.0)
POTASSIUM: 3.7 mmol/L (ref 3.5–5.1)
Sodium: 139 mmol/L (ref 135–145)
TCO2: 26 mmol/L (ref 22–32)

## 2018-06-08 MED ORDER — GABAPENTIN 100 MG PO CAPS: 100.0000 mg | ORAL_CAPSULE | Freq: Two times a day (BID) | ORAL | 0 refills | Status: AC

## 2018-06-08 MED ORDER — GABAPENTIN 100 MG PO CAPS: 100.0000 mg | ORAL_CAPSULE | Freq: Two times a day (BID) | ORAL | 0 refills | Status: DC

## 2018-06-08 NOTE — Discharge Instructions (Signed)
Cervical swab obtained.  We will follow up with you regarding abnormal results Vital signs and blood work within normal limits at this time.  Condition is not life threatening.   Please follow up with GYN for further evaluation and management Avoid rough sex practices until bleeding resolves, and/or you are clear by your GYN Return here or go to ER if you have any new or worsening symptoms fever, chills, nausea, vomiting, abdominal or pelvic pain, painful intercourse, vaginal discharge, vaginal bleeding, persistent symptoms despite treatment, etc...  Gabapentin refilled.

## 2018-06-08 NOTE — ED Triage Notes (Signed)
Pt presents with abnormal large amounts of vaginal bleeding after her normal cycle.  Pt complains of pain in pelvic area, nausea, and dizziness.

## 2018-06-08 NOTE — ED Provider Notes (Signed)
[facility]   [id number] 06/08/18 Arrival Time: 1706   WU:[id number]:VAGINAL bleeding  SUBJECTIVE:  [name] [name] is a 32 y.o. female who presents with complaints of abrupt vaginal bleeding x 3 days.  Symptoms began after "rough" sexual intercourse with her partner which included the use of sex toys.  Describes bleeding a bright red and soaking through 5-6 "super" tampons daily.  She denies aggravating or alleviating factors.  Hx significant for cervical dysplasia with abnormal PAP.  Had colposcopy in 2018.  Has had similar symptoms in the past following intercourse, but symptoms did not last this long. Complains of lightheadedness, and nausea.  She denies fever, chills, vomiting, abdominal or pelvic pain, urinary symptoms, vaginal itching, vaginal odor, dyspareunia, vaginal rashes or lesions.   Patient's last menstrual period was 05/28/2018. Denies hx of abnormal periods.    Upon discharge request gabapentin refill for a couple of days for "nerves."  ROS: As per HPI.  Past Medical History:  Diagnosis Date  . CIN III (cervical intraepithelial neoplasia grade III) with severe dysplasia    2009  . Genital HSV   . 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 Morphea   . Scoliosis    Past Surgical History:  Procedure Laterality Date  . NO PAST SURGERIES    . TUBAL LIGATION N/A 09/01/2013   Procedure: POST PARTUM TUBAL LIGATION;  Surgeon: Willodean Rosenthalarolyn Harraway-Smith, MD;  Location: [facility];  Service: Gynecology;  Laterality: N/A;  with filsie clips   No Known Allergies No current facility-administered medications on file prior to encounter.    Current Outpatient Medications on File Prior to Encounter  Medication Sig Dispense Refill  . acyclovir ointment (ZOVIRAX) 5 % Apply topically.    . ALPRAZolam (XANAX) 0.25 MG tablet Take by mouth.    . valACYclovir (VALTREX) 500 MG tablet Take by mouth.      Social History   Socioeconomic History  .  Marital status: Single    Spouse name: Not on file  . Number of children: Not on file  . Years of education: Not on file  . Highest education level: Not on file  Occupational History  . Not on file  Social Needs  . Financial resource strain: Not on file  . Food insecurity:    Worry: Not on file    Inability: Not on file  . Transportation needs:    Medical: Not on file    Non-medical: Not on file  Tobacco Use  . Smoking status: Current Every Day Smoker    Packs/day: 0.25    Types: Cigarettes  . Smokeless tobacco: Never Used  Substance and Sexual Activity  . Alcohol use: No  . Drug use: Yes    Types: Marijuana  . Sexual activity: Yes    Birth control/protection: Surgical  Lifestyle  . Physical activity:    Days per week: Not on file    Minutes per session: Not on file  . Stress: Not on file  Relationships  . Social connections:    Talks on phone: Not on file    Gets together: Not on file    Attends religious service: Not on file    Active member of club or organization: Not on file    Attends meetings of clubs or organizations: Not on file    Relationship status: Not on file  . Intimate partner violence:    Fear of current or ex partner: Not on file  Emotionally abused: Not on file    Physically abused: Not on file    Forced sexual activity: Not on file  Other Topics Concern  . Not on file  Social History Narrative  . Not on file   Family History  Problem Relation Age of Onset  . Diabetes Other   . Hypertension Other     OBJECTIVE:  Vitals:   06/08/18 1805  BP: 127/73  Pulse: 67  Resp: 20  Temp: 98 F (36.7 C)  TempSrc: Oral  SpO2: 100%     General appearance: Alert, NAD, appears stated age Head: NCAT Throat: lips, mucosa, and tongue normal; teeth and gums normal Lungs: CTA bilaterally without adventitious breath sounds Heart: regular rate and rhythm.  Radial pulses 2+ symmetrical bilaterally Back: no CVA tenderness Abdomen: soft, non-tender;  bowel sounds normal; no guarding GU: External examination without vulvar lesions or erythema Bimanual exam: Negative for cervical motion or adenexal tenderness; Speculum exam: Cervix with active bleeding; mild amount of blood in the vaginal canal; Cervical swab obtained Skin: warm and dry Psychological:  Alert and cooperative. Normal mood and affect.  LABS:  Results for orders placed or performed during the hospital encounter of 06/08/18  I-STAT, chem 8  Result Value Ref Range   Sodium 139 135 - 145 mmol/L   Potassium 3.7 3.5 - 5.1 mmol/L   Chloride 106 98 - 111 mmol/L   BUN 8 6 - 20 mg/dL   Creatinine, Ser 1.61 0.44 - 1.00 mg/dL   Glucose, Bld 85 70 - 99 mg/dL   Calcium, Ion 0.96 0.45 - 1.40 mmol/L   TCO2 26 22 - 32 mmol/L   Hemoglobin 14.3 12.0 - 15.0 g/dL   HCT 40.9 81.1 - 91.4 %    Labs Reviewed  I-STAT CHEM 8, ED  POCT I-STAT, CHEM 8  CERVICOVAGINAL ANCILLARY ONLY    ASSESSMENT & PLAN:  1. Vaginal bleeding   2. Medication refill    Discussed pt case with Dr. Tracie Harrier.  VS normal, blood work did not show anemia.  Exam positive for blood appreciated from cervix.  Vaginal bleeding not life-threatening at this point.  Recommend follow up with GYN for further evaluation and management.    Meds ordered this encounter  Medications  . gabapentin (NEURONTIN) 100 MG capsule    Sig: Take 1 capsule (100 mg total) by mouth 2 (two) times daily for 3 days.    Dispense:  6 capsule    Refill:  0    Order Specific Question:   Supervising Provider    Answer:   Eustace Moore [[id number]]    Pending: Labs Reviewed  I-STAT CHEM 8, ED  POCT I-STAT, CHEM 8  CERVICOVAGINAL ANCILLARY ONLY    Cervical swab obtained.  We will follow up with you regarding abnormal results Vital signs and blood work within normal limits at this time.  Condition is not life threatening.   Please follow up with GYN for further evaluation and management Avoid rough sex practices until bleeding resolves,  and/or you are cleared by your GYN Return here or go to ER if you have any new or worsening symptoms lightheadedness, dizziness, feeling as if you are going to faint, fever, chills, nausea, vomiting, abdominal or pelvic pain, painful intercourse, vaginal discharge, vaginal bleeding, persistent symptoms despite treatment, etc...  Gabapentin refilled.    Reviewed expectations re: course of current medical issues. Questions answered. Outlined signs and symptoms indicating need for more acute intervention. Patient verbalized understanding. After Visit  Summary given.       Rennis HardingWurst, Karoline Fleer, PA-C 06/08/18 [id number]

## 2018-06-11 ENCOUNTER — Telehealth (HOSPITAL_COMMUNITY): Payer: Self-pay | Admitting: Emergency Medicine

## 2018-06-11 LAB — CERVICOVAGINAL ANCILLARY ONLY
BACTERIAL VAGINITIS: POSITIVE — AB
Candida vaginitis: NEGATIVE
Chlamydia: NEGATIVE
Neisseria Gonorrhea: NEGATIVE
Trichomonas: NEGATIVE

## 2018-06-11 MED ORDER — METRONIDAZOLE 500 MG PO TABS: 500.0000 mg | ORAL_TABLET | Freq: Two times a day (BID) | ORAL | 0 refills | Status: AC

## 2018-06-11 NOTE — Telephone Encounter (Signed)
Bacterial vaginosis is positive. This was not treated at the urgent care visit.  Flagyl 500 mg BID x 7 days #14 no refills sent to patients pharmacy of choice.    Attempted to reach patient. No answer at this time. Voicemail left.    

## 2018-06-15 ENCOUNTER — Telehealth (HOSPITAL_COMMUNITY): Payer: Self-pay | Admitting: Emergency Medicine

## 2018-06-15 NOTE — Telephone Encounter (Signed)
Attempted to reach patient x2. No answer at this time. Voicemail left.    

## 2018-06-15 NOTE — Telephone Encounter (Signed)
Pt returned call, given results. All questions answered.
